# Patient Record
Sex: Female | Born: 1950 | Race: Black or African American | Hispanic: No | Smoking: Current every day smoker
Health system: Southern US, Community
[De-identification: ages and names within clinical notes are randomized; demographics above are authoritative.]

## PROBLEM LIST (undated history)

## (undated) DIAGNOSIS — E119 Type 2 diabetes mellitus without complications: Secondary | ICD-10-CM

## (undated) DIAGNOSIS — I10 Essential (primary) hypertension: Secondary | ICD-10-CM

## (undated) DIAGNOSIS — N183 Chronic kidney disease, stage 3 unspecified: Secondary | ICD-10-CM

## (undated) DIAGNOSIS — F259 Schizoaffective disorder, unspecified: Secondary | ICD-10-CM

## (undated) DIAGNOSIS — F411 Generalized anxiety disorder: Secondary | ICD-10-CM

---

## 2004-04-14 ENCOUNTER — Inpatient Hospital Stay (HOSPITAL_COMMUNITY): Admission: EM | Admit: 2004-04-14 | Discharge: 2004-04-20 | Payer: Self-pay | Admitting: Emergency Medicine

## 2004-04-17 ENCOUNTER — Encounter: Payer: Self-pay | Admitting: Pulmonary Disease

## 2004-04-24 ENCOUNTER — Encounter: Admission: RE | Admit: 2004-04-24 | Discharge: 2004-04-24 | Payer: Self-pay | Admitting: Nephrology

## 2004-05-23 ENCOUNTER — Encounter: Admission: RE | Admit: 2004-05-23 | Discharge: 2004-05-23 | Payer: Self-pay | Admitting: Nephrology

## 2004-10-30 IMAGING — CR DG ANKLE COMPLETE 3+V*R*
3 series · 3 of 3 positions shown · non-contrast
Comparison: none

CLINICAL DATA: Pain and swelling of both ankles.  
 LEFT ANKLE (THREE VIEWS)
 IMPRESSION
 1.  Nonspecific generalized soft tissue swelling. 
 RIGHT ANKLE (THREE VIEWS)
 Again, there is diffuse nonspecific soft tissue swelling.  No abnormality of the ankle joint itself.  Ordinary mild degenerative changes are seen in the midfoot.  There are incidental calcaneal spurs. 
 1.  Nonspecific generalized soft tissue swelling without likely significant osseous or articular pathology.

[view not recorded (1 of 3)]
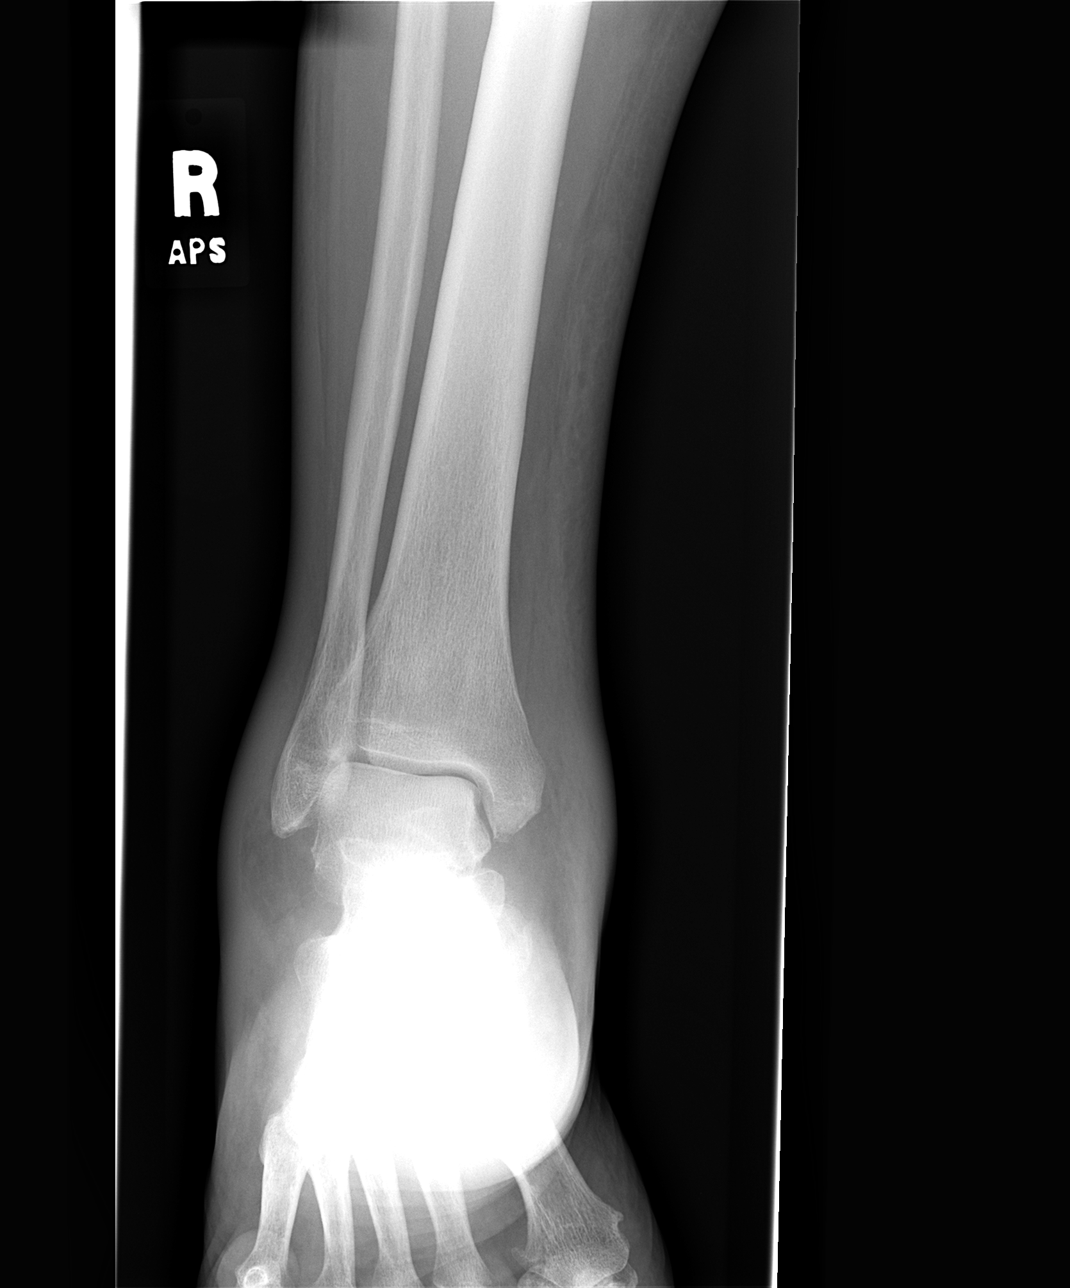

[view not recorded (2 of 3)]
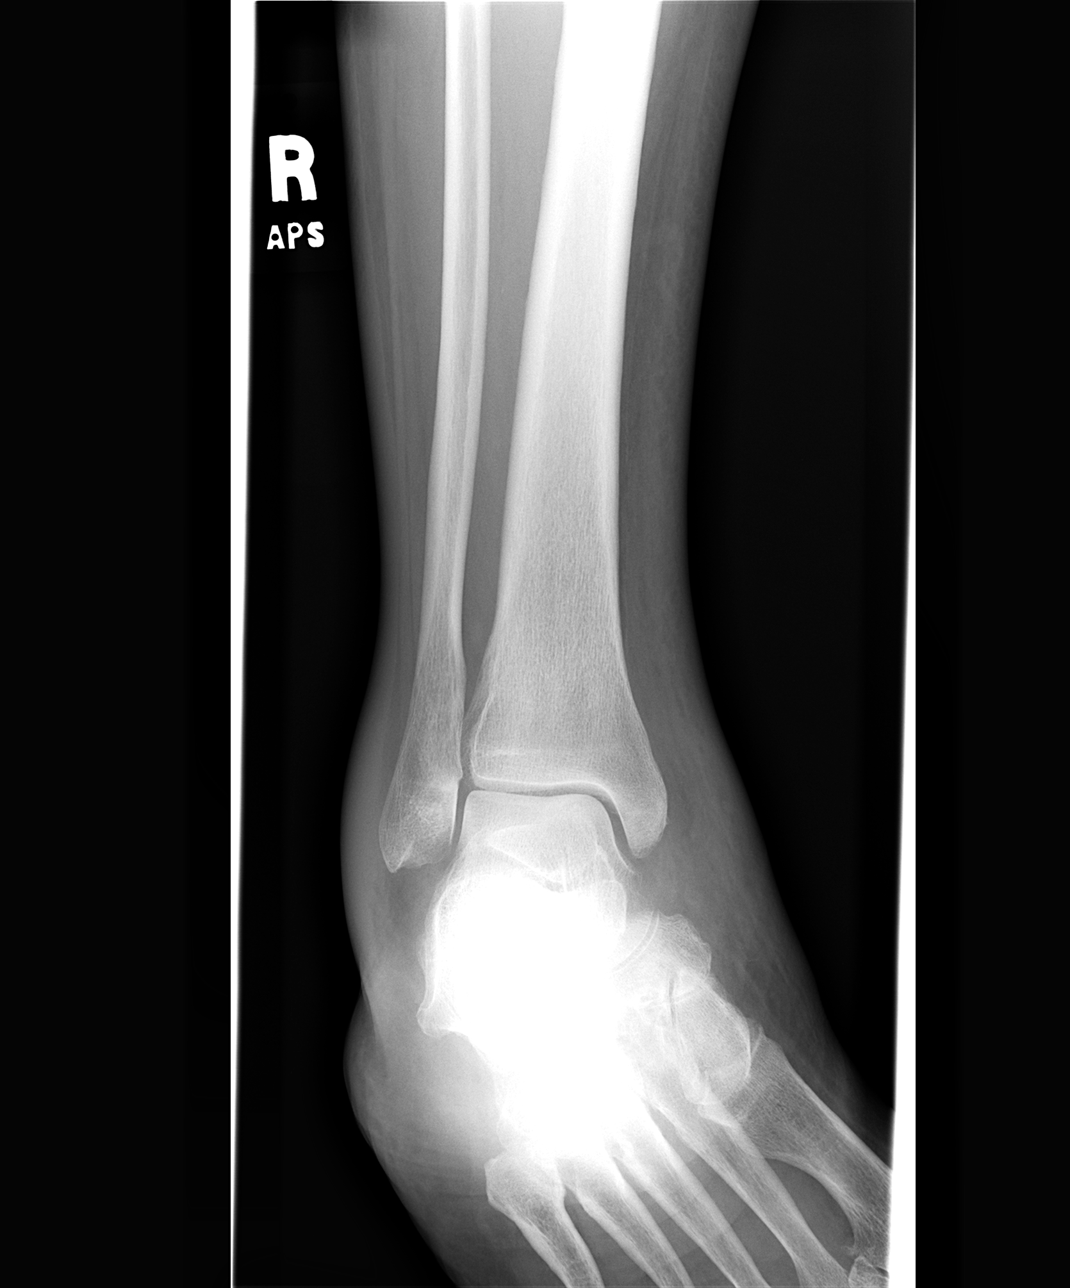

[view not recorded (3 of 3)]
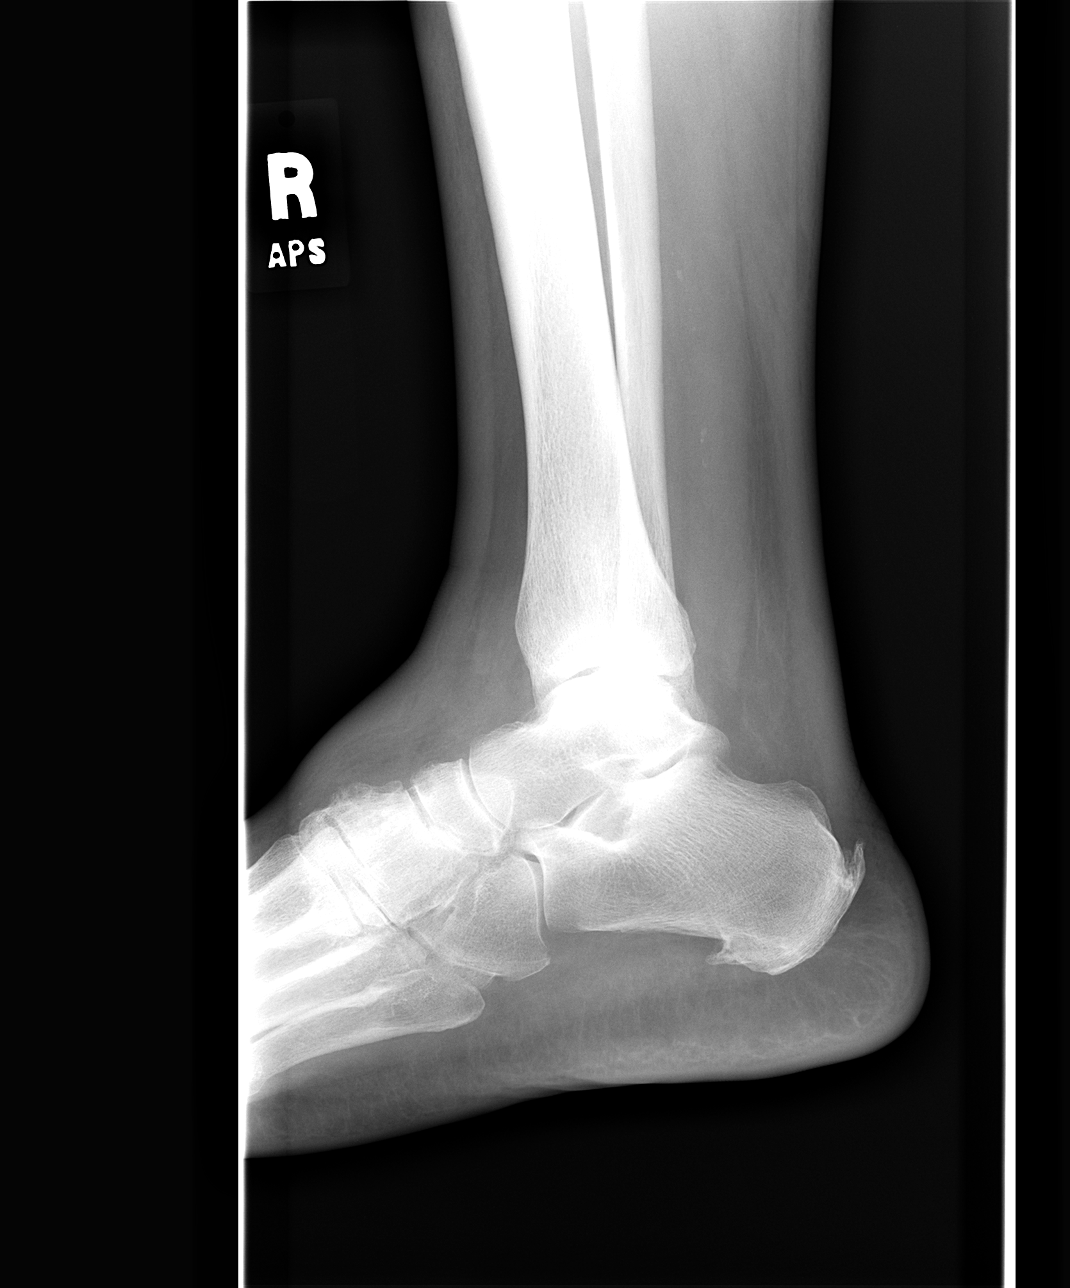

[3 of 3 positions shown; findings below may reference images not displayed]

## 2005-03-12 ENCOUNTER — Other Ambulatory Visit: Admission: RE | Admit: 2005-03-12 | Discharge: 2005-03-12 | Payer: Self-pay | Admitting: Nephrology

## 2006-05-12 ENCOUNTER — Emergency Department (HOSPITAL_COMMUNITY): Admission: EM | Admit: 2006-05-12 | Discharge: 2006-05-12 | Payer: Self-pay | Admitting: Emergency Medicine

## 2020-06-07 ENCOUNTER — Emergency Department (HOSPITAL_COMMUNITY): Payer: Medicare HMO

## 2020-06-07 ENCOUNTER — Other Ambulatory Visit: Payer: Self-pay

## 2020-06-07 ENCOUNTER — Inpatient Hospital Stay (HOSPITAL_COMMUNITY)
Admission: EM | Admit: 2020-06-07 | Discharge: 2020-06-14 | DRG: 177 | Disposition: A | Discharge status: Skilled Nursing Facility | Payer: Medicare HMO | Source: Skilled Nursing Facility | Attending: Internal Medicine | Admitting: Internal Medicine

## 2020-06-07 ENCOUNTER — Encounter (HOSPITAL_COMMUNITY): Payer: Self-pay | Admitting: Emergency Medicine

## 2020-06-07 DIAGNOSIS — Z886 Allergy status to analgesic agent status: Secondary | ICD-10-CM

## 2020-06-07 DIAGNOSIS — E875 Hyperkalemia: Secondary | ICD-10-CM | POA: Diagnosis present

## 2020-06-07 DIAGNOSIS — Z823 Family history of stroke: Secondary | ICD-10-CM

## 2020-06-07 DIAGNOSIS — N183 Chronic kidney disease, stage 3 unspecified: Secondary | ICD-10-CM | POA: Diagnosis not present

## 2020-06-07 DIAGNOSIS — D631 Anemia in chronic kidney disease: Secondary | ICD-10-CM | POA: Diagnosis present

## 2020-06-07 DIAGNOSIS — F411 Generalized anxiety disorder: Secondary | ICD-10-CM | POA: Diagnosis present

## 2020-06-07 DIAGNOSIS — N179 Acute kidney failure, unspecified: Secondary | ICD-10-CM | POA: Diagnosis present

## 2020-06-07 DIAGNOSIS — E869 Volume depletion, unspecified: Secondary | ICD-10-CM | POA: Diagnosis present

## 2020-06-07 DIAGNOSIS — Z83511 Family history of glaucoma: Secondary | ICD-10-CM

## 2020-06-07 DIAGNOSIS — Z781 Physical restraint status: Secondary | ICD-10-CM | POA: Diagnosis not present

## 2020-06-07 DIAGNOSIS — R1312 Dysphagia, oropharyngeal phase: Secondary | ICD-10-CM | POA: Diagnosis present

## 2020-06-07 DIAGNOSIS — R4182 Altered mental status, unspecified: Secondary | ICD-10-CM

## 2020-06-07 DIAGNOSIS — E1122 Type 2 diabetes mellitus with diabetic chronic kidney disease: Secondary | ICD-10-CM | POA: Diagnosis present

## 2020-06-07 DIAGNOSIS — Z8249 Family history of ischemic heart disease and other diseases of the circulatory system: Secondary | ICD-10-CM | POA: Diagnosis not present

## 2020-06-07 DIAGNOSIS — N1832 Chronic kidney disease, stage 3b: Secondary | ICD-10-CM | POA: Diagnosis present

## 2020-06-07 DIAGNOSIS — R0902 Hypoxemia: Secondary | ICD-10-CM

## 2020-06-07 DIAGNOSIS — J9601 Acute respiratory failure with hypoxia: Secondary | ICD-10-CM | POA: Diagnosis present

## 2020-06-07 DIAGNOSIS — R471 Dysarthria and anarthria: Secondary | ICD-10-CM | POA: Diagnosis present

## 2020-06-07 DIAGNOSIS — G9341 Metabolic encephalopathy: Secondary | ICD-10-CM | POA: Diagnosis present

## 2020-06-07 DIAGNOSIS — F329 Major depressive disorder, single episode, unspecified: Secondary | ICD-10-CM | POA: Diagnosis present

## 2020-06-07 DIAGNOSIS — J690 Pneumonitis due to inhalation of food and vomit: Secondary | ICD-10-CM | POA: Diagnosis present

## 2020-06-07 DIAGNOSIS — E669 Obesity, unspecified: Secondary | ICD-10-CM | POA: Diagnosis present

## 2020-06-07 DIAGNOSIS — F259 Schizoaffective disorder, unspecified: Secondary | ICD-10-CM | POA: Diagnosis present

## 2020-06-07 DIAGNOSIS — Z20822 Contact with and (suspected) exposure to covid-19: Secondary | ICD-10-CM | POA: Diagnosis present

## 2020-06-07 DIAGNOSIS — I129 Hypertensive chronic kidney disease with stage 1 through stage 4 chronic kidney disease, or unspecified chronic kidney disease: Secondary | ICD-10-CM | POA: Diagnosis present

## 2020-06-07 DIAGNOSIS — Z8673 Personal history of transient ischemic attack (TIA), and cerebral infarction without residual deficits: Secondary | ICD-10-CM

## 2020-06-07 DIAGNOSIS — Z79899 Other long term (current) drug therapy: Secondary | ICD-10-CM

## 2020-06-07 DIAGNOSIS — N170 Acute kidney failure with tubular necrosis: Secondary | ICD-10-CM | POA: Diagnosis not present

## 2020-06-07 DIAGNOSIS — Z882 Allergy status to sulfonamides status: Secondary | ICD-10-CM

## 2020-06-07 DIAGNOSIS — Z96649 Presence of unspecified artificial hip joint: Secondary | ICD-10-CM | POA: Diagnosis present

## 2020-06-07 DIAGNOSIS — E1121 Type 2 diabetes mellitus with diabetic nephropathy: Secondary | ICD-10-CM | POA: Diagnosis not present

## 2020-06-07 DIAGNOSIS — Z833 Family history of diabetes mellitus: Secondary | ICD-10-CM | POA: Diagnosis not present

## 2020-06-07 DIAGNOSIS — Z6837 Body mass index (BMI) 37.0-37.9, adult: Secondary | ICD-10-CM | POA: Diagnosis not present

## 2020-06-07 DIAGNOSIS — R0602 Shortness of breath: Secondary | ICD-10-CM | POA: Diagnosis not present

## 2020-06-07 DIAGNOSIS — Z794 Long term (current) use of insulin: Secondary | ICD-10-CM

## 2020-06-07 DIAGNOSIS — J189 Pneumonia, unspecified organism: Secondary | ICD-10-CM

## 2020-06-07 DIAGNOSIS — Z7982 Long term (current) use of aspirin: Secondary | ICD-10-CM

## 2020-06-07 HISTORY — DX: Type 2 diabetes mellitus without complications: E11.9

## 2020-06-07 HISTORY — DX: Generalized anxiety disorder: F41.1

## 2020-06-07 HISTORY — DX: Schizoaffective disorder, unspecified: F25.9

## 2020-06-07 HISTORY — DX: Chronic kidney disease, stage 3 unspecified: N18.30

## 2020-06-07 HISTORY — DX: Essential (primary) hypertension: I10

## 2020-06-07 LAB — COMPREHENSIVE METABOLIC PANEL
ALT: 10 U/L (ref 0–44)
AST: 12 U/L — ABNORMAL LOW (ref 15–41)
Albumin: 3.5 g/dL (ref 3.5–5.0)
Alkaline Phosphatase: 60 U/L (ref 38–126)
Anion gap: 11 (ref 5–15)
BUN: 31 mg/dL — ABNORMAL HIGH (ref 8–23)
CO2: 28 mmol/L (ref 22–32)
Calcium: 9.2 mg/dL (ref 8.9–10.3)
Chloride: 103 mmol/L (ref 98–111)
Creatinine, Ser: 2.7 mg/dL — ABNORMAL HIGH (ref 0.44–1.00)
GFR calc Af Amer: 20 mL/min — ABNORMAL LOW (ref >60)
GFR calc non Af Amer: 17 mL/min — ABNORMAL LOW (ref >60)
Glucose, Bld: 146 mg/dL — ABNORMAL HIGH (ref 70–99)
Potassium: 4.4 mmol/L (ref 3.5–5.1)
Sodium: 142 mmol/L (ref 135–145)
Total Bilirubin: 0.3 mg/dL (ref 0.3–1.2)
Total Protein: 6.8 g/dL (ref 6.5–8.1)

## 2020-06-07 LAB — CBC WITH DIFFERENTIAL/PLATELET
Abs Immature Granulocytes: 0.01 10*3/uL (ref 0.00–0.07)
Basophils Absolute: 0 10*3/uL (ref 0.0–0.1)
Basophils Relative: 1 %
Eosinophils Absolute: 0.1 10*3/uL (ref 0.0–0.5)
Eosinophils Relative: 2 %
HCT: 40.6 % (ref 36.0–46.0)
Hemoglobin: 12.5 g/dL (ref 12.0–15.0)
Immature Granulocytes: 0 %
Lymphocytes Relative: 26 %
Lymphs Abs: 1.8 10*3/uL (ref 0.7–4.0)
MCH: 30.9 pg (ref 26.0–34.0)
MCHC: 30.8 g/dL (ref 30.0–36.0)
MCV: 100.5 fL — ABNORMAL HIGH (ref 80.0–100.0)
Monocytes Absolute: 0.6 10*3/uL (ref 0.1–1.0)
Monocytes Relative: 8 %
Neutro Abs: 4.3 10*3/uL (ref 1.7–7.7)
Neutrophils Relative %: 63 %
Platelets: 172 10*3/uL (ref 150–400)
RBC: 4.04 MIL/uL (ref 3.87–5.11)
RDW: 13.5 % (ref 11.5–15.5)
WBC: 6.8 10*3/uL (ref 4.0–10.5)
nRBC: 0 % (ref 0.0–0.2)

## 2020-06-07 LAB — URINALYSIS, ROUTINE W REFLEX MICROSCOPIC
Bacteria, UA: NONE SEEN
Bilirubin Urine: NEGATIVE
Glucose, UA: NEGATIVE mg/dL
Hgb urine dipstick: NEGATIVE
Ketones, ur: NEGATIVE mg/dL
Nitrite: NEGATIVE
Protein, ur: 100 mg/dL — AB
Specific Gravity, Urine: 1.018 (ref 1.005–1.030)
pH: 5 (ref 5.0–8.0)

## 2020-06-07 LAB — HEMOGLOBIN A1C
Hgb A1c MFr Bld: 6.8 % — ABNORMAL HIGH (ref 4.8–5.6)
Mean Plasma Glucose: 148.46 mg/dL

## 2020-06-07 LAB — I-STAT CHEM 8, ED
BUN: 41 mg/dL — ABNORMAL HIGH (ref 8–23)
Calcium, Ion: 1.2 mmol/L (ref 1.15–1.40)
Chloride: 104 mmol/L (ref 98–111)
Creatinine, Ser: 3 mg/dL — ABNORMAL HIGH (ref 0.44–1.00)
Glucose, Bld: 140 mg/dL — ABNORMAL HIGH (ref 70–99)
HCT: 38 % (ref 36.0–46.0)
Hemoglobin: 12.9 g/dL (ref 12.0–15.0)
Potassium: 5.4 mmol/L — ABNORMAL HIGH (ref 3.5–5.1)
Sodium: 142 mmol/L (ref 135–145)
TCO2: 30 mmol/L (ref 22–32)

## 2020-06-07 LAB — BLOOD GAS, ARTERIAL
Acid-Base Excess: 0.5 mmol/L (ref 0.0–2.0)
Bicarbonate: 26.5 mmol/L (ref 20.0–28.0)
Drawn by: 257701
O2 Saturation: 92.1 %
Patient temperature: 97
pCO2 arterial: 51.7 mmHg — ABNORMAL HIGH (ref 32.0–48.0)
pH, Arterial: 7.33 — ABNORMAL LOW (ref 7.350–7.450)
pO2, Arterial: 71.8 mmHg — ABNORMAL LOW (ref 83.0–108.0)

## 2020-06-07 LAB — APTT: aPTT: 26 seconds (ref 24–36)

## 2020-06-07 LAB — CBG MONITORING, ED: Glucose-Capillary: 107 mg/dL — ABNORMAL HIGH (ref 70–99)

## 2020-06-07 LAB — TROPONIN I (HIGH SENSITIVITY)
Troponin I (High Sensitivity): 26 ng/L — ABNORMAL HIGH (ref <18)
Troponin I (High Sensitivity): 24 ng/L — ABNORMAL HIGH (ref <18)

## 2020-06-07 LAB — SARS CORONAVIRUS 2 BY RT PCR (HOSPITAL ORDER, PERFORMED IN ~~LOC~~ HOSPITAL LAB): SARS Coronavirus 2: NEGATIVE

## 2020-06-07 LAB — PROTIME-INR
INR: 0.9 (ref 0.8–1.2)
Prothrombin Time: 12 seconds (ref 11.4–15.2)

## 2020-06-07 LAB — LACTIC ACID, PLASMA: Lactic Acid, Venous: 1.5 mmol/L (ref 0.5–1.9)

## 2020-06-07 LAB — MRSA PCR SCREENING: MRSA by PCR: NEGATIVE

## 2020-06-07 LAB — PROCALCITONIN: Procalcitonin: <0.1 ng/mL

## 2020-06-07 LAB — BRAIN NATRIURETIC PEPTIDE: B Natriuretic Peptide: 169.4 pg/mL — ABNORMAL HIGH (ref 0.0–100.0)

## 2020-06-07 LAB — GLUCOSE, CAPILLARY
Glucose-Capillary: 77 mg/dL (ref 70–99)
Glucose-Capillary: 89 mg/dL (ref 70–99)

## 2020-06-07 MED ORDER — SODIUM CHLORIDE 0.9 % IV BOLUS
250.0000 mL | Freq: Once | INTRAVENOUS | Status: AC
  Administered 2020-06-07: 250 mL via INTRAVENOUS

## 2020-06-07 MED ORDER — NICOTINE 14 MG/24HR TD PT24
14.0000 mg | MEDICATED_PATCH | Freq: Every day | TRANSDERMAL | Status: DC
  Administered 2020-06-08 – 2020-06-14 (×8): 14 mg via TRANSDERMAL
  Filled 2020-06-07 (×9): qty 1

## 2020-06-07 MED ORDER — CHLORHEXIDINE GLUCONATE CLOTH 2 % EX PADS
6.0000 | MEDICATED_PAD | Freq: Every day | CUTANEOUS | Status: DC
  Administered 2020-06-07 – 2020-06-11 (×5): 6 via TOPICAL

## 2020-06-07 MED ORDER — SODIUM CHLORIDE 0.9 % IV SOLN
1.5000 g | Freq: Two times a day (BID) | INTRAVENOUS | Status: DC
  Administered 2020-06-07 – 2020-06-09 (×4): 1.5 g via INTRAVENOUS
  Filled 2020-06-07: qty 4
  Filled 2020-06-07: qty 1.5
  Filled 2020-06-07 (×2): qty 4

## 2020-06-07 MED ORDER — VANCOMYCIN HCL 2000 MG/400ML IV SOLN
2000.0000 mg | INTRAVENOUS | Status: DC
  Filled 2020-06-07: qty 400

## 2020-06-07 MED ORDER — SODIUM CHLORIDE 0.9 % IV SOLN: INTRAVENOUS | Status: DC

## 2020-06-07 MED ORDER — INSULIN ASPART 100 UNIT/ML ~~LOC~~ SOLN
0.0000 [IU] | SUBCUTANEOUS | Status: DC
  Administered 2020-06-08 – 2020-06-10 (×2): 2 [IU] via SUBCUTANEOUS
  Administered 2020-06-11: 3 [IU] via SUBCUTANEOUS
  Administered 2020-06-11: 2 [IU] via SUBCUTANEOUS
  Administered 2020-06-12: 3 [IU] via SUBCUTANEOUS
  Administered 2020-06-12 – 2020-06-14 (×3): 2 [IU] via SUBCUTANEOUS

## 2020-06-07 MED ORDER — CHLORHEXIDINE GLUCONATE 0.12 % MT SOLN
15.0000 mL | Freq: Two times a day (BID) | OROMUCOSAL | Status: DC
  Administered 2020-06-07 – 2020-06-13 (×11): 15 mL via OROMUCOSAL
  Filled 2020-06-07 (×11): qty 15

## 2020-06-07 MED ORDER — HEPARIN SODIUM (PORCINE) 5000 UNIT/ML IJ SOLN
5000.0000 [IU] | Freq: Three times a day (TID) | INTRAMUSCULAR | Status: DC
  Administered 2020-06-07 – 2020-06-14 (×20): 5000 [IU] via SUBCUTANEOUS
  Filled 2020-06-07 (×19): qty 1

## 2020-06-07 MED ORDER — VANCOMYCIN HCL 10 G IV SOLR
2250.0000 mg | INTRAVENOUS | Status: DC
  Filled 2020-06-07: qty 2250

## 2020-06-07 MED ORDER — SODIUM CHLORIDE 0.9 % IV SOLN
2.0000 g | Freq: Once | INTRAVENOUS | Status: DC
  Filled 2020-06-07: qty 2

## 2020-06-07 MED ORDER — ORAL CARE MOUTH RINSE
15.0000 mL | Freq: Two times a day (BID) | OROMUCOSAL | Status: DC
  Administered 2020-06-08 – 2020-06-09 (×4): 15 mL via OROMUCOSAL

## 2020-06-07 NOTE — Hospital Course (Addendum)
Carrie Benson is a 69 yo female with PMH hypertension, CKDIII, depression, schizoaffective disorder, GAD, type 2 diabetes who presented from her SNF appearing altered and hypoxic.  She was reportedly only responsive to painful stimuli upon arrival.  She was requiring 10 L nonrebreather via EMS on arrival which improved to 4 L nasal cannula oxygen in the ER.   She is a poor historian and is unable to provide any significant collateral information.  No family is present in the ER. There was concern for possible sepsis and she was started on vancomycin and cefepime. 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 Lab work-up notable for: UA small leukocyte, nitrite negative, no bacteria seen COVID-19 negative ABG 7.33/51/71 WBC 6.8, hemoglobin 12.5, platelets 172 Sodium 142, K4.4, BUN 31, creatinine 2.7  During interview, she appeared to be overall volume depleted.  She has several missing teeth and overall poor dentition along with dysarthric speech which did further raise concern for possible aspiration.  Her baseline is not quite known. Given her significantly decreased mentation on arrival and hypoxia she is admitted to the SDU for close monitoring overnight.

## 2020-06-07 NOTE — ED Provider Notes (Signed)
[facility]-EMERGENCY DEPT Provider Note   CSN: [id number] Arrival date & time: 06/07/20  1543     History Chief Complaint  Patient presents with  . Shortness of Breath    Carrie Benson is a 69 y.o. female.  The history is provided by the patient, medical records, the EMS personnel and a relative. No language interpreter was used.  Shortness of Breath  Carrie Benson is a 69 y.o. female who presents to the Emergency Department complaining of hypoxia. Level V caveat due to altered mental status. History is provided by EMS. She presents the emergency department by EMS from a nursing facility for hypoxia that started this morning. She was found to have sats in the 80s on room air and was placed on a simple facemask at 5 L with increase in sats to 95%. The report increased lethargy today and EMS was called. On ED arrival she has no complaints but is minimally verbal.  She is a resident of [facility] and rehab. Emergency contact Belva Bertin, [phone]    Past Medical History:  Diagnosis Date  . CKD (chronic kidney disease) stage 3, GFR 30-59 ml/min   . Diabetes mellitus (HCC)   . GAD (generalized anxiety disorder)   . HTN (hypertension)   . Schizoaffective disorder [facility])     Patient Active Problem List   Diagnosis Date Noted  . History of hip replacement 06/07/2020  . Aspiration pneumonia (HCC) 06/07/2020  . Acute respiratory failure with hypoxia (HCC) 06/07/2020  . Type 2 diabetes mellitus with stage 3 chronic kidney disease (HCC) 06/07/2020  . Acute renal failure superimposed on stage 3 chronic kidney disease (HCC) 06/07/2020      OB History   No obstetric history on file.     Family History  Problem Relation Age of Onset  . Diabetes Mother   . Hypertension Mother   . Stroke Mother   . Diabetes Father   . Glaucoma Father   . Hypertension Father   . Stroke Father     Social History   Tobacco Use  . Smoking status: Not on  file  Substance Use Topics  . Alcohol use: Not on file  . Drug use: Not on file    Home Medications Prior to Admission medications   Medication Sig Start Date End Date Taking? Authorizing Provider  acidophilus (RISAQUAD) CAPS capsule Take 1 capsule by mouth daily.   Yes [provider]  albuterol (VENTOLIN HFA) 108 (90 Base) MCG/ACT inhaler Inhale 2 puffs into the lungs every 6 (six) hours as needed for wheezing or shortness of breath.   Yes [provider]  amLODipine (NORVASC) 10 MG tablet Take 10 mg by mouth daily.   Yes [provider]  amoxicillin-clavulanate (AUGMENTIN) 875-125 MG tablet Take 1 tablet by mouth 2 (two) times daily.   Yes [provider]  aspirin EC 81 MG tablet Take 81 mg by mouth daily. Swallow whole.   Yes [provider]  atorvastatin (LIPITOR) 80 MG tablet Take 80 mg by mouth daily. 04/19/15  Yes [provider]  benztropine (COGENTIN) 0.5 MG tablet Take 0.5 mg by mouth 2 (two) times daily. 01/18/20  Yes [provider]  chlorhexidine (PERIDEX) 0.12 % solution Use as directed 15 mLs in the mouth or throat 4 (four) times daily.    Yes [provider]  FLUoxetine (PROZAC) 20 MG capsule Take 20 mg by mouth daily. 01/18/20  Yes [provider]  insulin lispro (HUMALOG) 100 UNIT/ML injection Inject 2-8 Units into the skin in the morning, at noon, and at bedtime. 0-200= 0 units 201-250= 2 units 251-300= 4 units 301-350= 6 units 351-400=8 units >400 notify MD 06/04/20  Yes [provider]  ipratropium-albuterol (DUONEB) 0.5-2.5 (3) MG/3ML SOLN Take 3 mLs by nebulization in the morning and at bedtime.   Yes [provider]  metoprolol tartrate (LOPRESSOR) 25 MG tablet Take 25 mg by mouth 2 (two) times daily.   Yes [provider]  mirabegron ER (MYRBETRIQ) 50 MG TB24 tablet Take 50 mg by mouth daily. 04/19/15  Yes [provider]  nicotine (NICODERM CQ - DOSED IN MG/24  HR) 7 mg/24hr patch Place 7 mg onto the skin daily.   Yes [provider]  pantoprazole (PROTONIX) 40 MG tablet Take 40 mg by mouth daily.   Yes [provider]  risperiDONE (RISPERDAL) 2 MG tablet Take 2 mg by mouth 2 (two) times daily. 01/18/20  Yes [provider]  traZODone (DESYREL) 50 MG tablet Take 25 mg by mouth at bedtime.   Yes [provider]    Allergies    Nsaids and Sulfa antibiotics  Review of Systems   Review of Systems  Unable to perform ROS: Mental status change  Respiratory: Positive for shortness of breath.     Physical Exam Updated Vital Signs BP (!) 146/73   Pulse 66   Temp 98 F (36.7 C) (Oral)   Resp 17   Ht 5\' 3"  (1.6 m)   Wt 95.3 kg   SpO2 95%   BMI 37.20 kg/m   Physical Exam Vitals and nursing note reviewed.  Constitutional:      Appearance: She is well-developed.     Comments: Lethargic  HENT:     Head: Normocephalic and atraumatic.     Comments: Left eye enucleation, chronic Cardiovascular:     Rate and Rhythm: Normal rate and regular rhythm.     Heart sounds: No murmur heard.   Pulmonary:     Effort: Pulmonary effort is normal. No respiratory distress.     Comments: Diffuse crackles Abdominal:     Palpations: Abdomen is soft.     Tenderness: There is no abdominal tenderness. There is no guarding or rebound.  Musculoskeletal:        General: No tenderness.  Skin:    General: Skin is warm and dry.  Neurological:     Comments: Lethargic. Arouses to voice. Profound generalized weakness. Mumbling speech.  Psychiatric:     Comments: Unable to assess.      ED Results / Procedures / Treatments   Labs (all labs ordered are listed, but only abnormal results are displayed) Labs Reviewed  COMPREHENSIVE METABOLIC PANEL - Abnormal; Notable for the following components:      Result Value   Glucose, Bld 146 (*)    BUN 31 (*)    Creatinine, Ser 2.70 (*)    AST 12 (*)    GFR calc non Af Amer 17 (*)     GFR calc Af Amer 20 (*)    All other components within normal limits  CBC WITH DIFFERENTIAL/PLATELET - Abnormal; Notable for the following components:   MCV 100.5 (*)    All other components within normal limits  URINALYSIS, ROUTINE W REFLEX MICROSCOPIC - Abnormal; Notable for the following components:   Protein, ur 100 (*)    Leukocytes,Ua SMALL (*)    All other components within normal limits  BRAIN  NATRIURETIC PEPTIDE - Abnormal; Notable for the following components:   B Natriuretic Peptide 169.4 (*)    All other components within normal limits  BLOOD GAS, ARTERIAL - Abnormal; Notable for the following components:   pH, Arterial 7.330 (*)    pCO2 arterial 51.7 (*)    pO2, Arterial 71.8 (*)    All other components within normal limits  HEMOGLOBIN A1C - Abnormal; Notable for the following components:   Hgb A1c MFr Bld 6.8 (*)    All other components within normal limits  I-STAT CHEM 8, ED - Abnormal; Notable for the following components:   Potassium 5.4 (*)    BUN 41 (*)    Creatinine, Ser 3.00 (*)    Glucose, Bld 140 (*)    All other components within normal limits  CBG MONITORING, ED - Abnormal; Notable for the following components:   Glucose-Capillary 107 (*)    All other components within normal limits  TROPONIN I (HIGH SENSITIVITY) - Abnormal; Notable for the following components:   Troponin I (High Sensitivity) 26 (*)    All other components within normal limits  TROPONIN I (HIGH SENSITIVITY) - Abnormal; Notable for the following components:   Troponin I (High Sensitivity) 24 (*)    All other components within normal limits  SARS CORONAVIRUS 2 BY RT PCR (HOSPITAL ORDER, PERFORMED IN [facility])  MRSA PCR SCREENING  CULTURE, BLOOD (ROUTINE X 2)  CULTURE, BLOOD (ROUTINE X 2)  URINE CULTURE  EXPECTORATED SPUTUM ASSESSMENT W REFEX TO RESP CULTURE  LACTIC ACID, PLASMA  APTT  PROTIME-INR  PROCALCITONIN  GLUCOSE, CAPILLARY  BASIC METABOLIC PANEL  CBC WITH  DIFFERENTIAL/PLATELET  MAGNESIUM  PROCALCITONIN  GLUCOSE, CAPILLARY    EKG EKG Interpretation  Date/Time:  Wednesday June 07 2020 16:24:40 EDT Ventricular Rate:  61 PR Interval:    QRS Duration: 88 QT Interval:  430 QTC Calculation: 434 R Axis:   15 Text Interpretation: Sinus rhythm Probable left atrial enlargement Posterior infarct, old Confirmed by Tilden Fossa [phone]) on 06/07/2020 5:26:11 PM   Radiology CT Head Wo Contrast  Result Date: 06/07/2020 CLINICAL DATA:  Altered mental status EXAM: CT HEAD WITHOUT CONTRAST TECHNIQUE: Contiguous axial images were obtained from the base of the skull through the vertex without intravenous contrast. COMPARISON:  08/28/2017 FINDINGS: Brain: Encephalomalacia is again seen within the a para falcine right frontal lobe, left corona radiata and cerebellar hemispheres bilaterally in keeping with remote infarcts. Since the prior examination, there has developed encephalomalacia within the right parietal lobe, left para falcine occipital lobe, and posterior left temporal lobe in keeping with remote infarcts, though new from prior examination. There is no evidence of acute intracranial hemorrhage or infarct. Moderate periventricular white matter changes are present likely reflecting the sequela of small vessel ischemia. No abnormal mass effect or midline shift. No abnormal intra or extra-axial mass lesion or fluid collection. The ventricular size is normal. Vascular: No hyperdense vasculature noted at the skull base. Extensive atherosclerotic calcification is seen within the carotid siphons. Skull: Intact Sinuses/Orbits: The paranasal sinuses are clear. 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 Right orbit is unremarkable. Other: Mastoid air cells and middle ear cavities are clear. Subcutaneous soft tissue nodule within the frontal scalp, axial image # 21/2, is indeterminate, but enlarged since prior examination measuring 11 mm. No erosion of the subjacent  calvarium. 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 No evidence of acute intracranial hemorrhage or infarct, however. Electronically Signed  By: Helyn NumbersAshesh  Parikh MD   On: 06/07/2020 18:40   DG Chest Port 1 View  Result Date: 06/07/2020 CLINICAL DATA:  Altered mental status EXAM: PORTABLE CHEST 1 VIEW COMPARISON:  05/29/2020 FINDINGS: Bibasilar pulmonary infiltrates have developed, infection versus aspiration. No pneumothorax or pleural effusion. Cardiac size within normal limits. Pulmonary vascularity is normal. No acute bone abnormality. IMPRESSION: Interval development of bibasilar pulmonary infiltrates, infection versus aspiration. Electronically Signed   By: Helyn NumbersAshesh  Parikh MD   On: 06/07/2020 18:05    Procedures Procedures (including critical care time)  Medications Ordered in ED Medications  heparin injection 5,000 Units (5,000 Units Subcutaneous Given 06/07/20 2220)  ampicillin-sulbactam (UNASYN) 1.5 g in sodium chloride 0.9 % 100 mL IVPB (0 g Intravenous Stopped 06/07/20 2248)  Chlorhexidine Gluconate Cloth 2 % PADS 6 each (6 each Topical Given 06/07/20 2059)  chlorhexidine (PERIDEX) 0.12 % solution 15 mL (15 mLs Mouth Rinse Given 06/07/20 2220)  MEDLINE mouth rinse (has no administration in time range)  insulin aspart (novoLOG) injection 0-15 Units (0 Units Subcutaneous Not Given 06/07/20 2344)  0.9 %  sodium chloride infusion ( Intravenous New Bag/Given 06/07/20 2216)  nicotine (NICODERM CQ - dosed in mg/24 hours) patch 14 mg (has no administration in time range)  sodium chloride 0.9 % bolus 250 mL (250 mLs Intravenous New Bag/Given 06/07/20 2216)    ED Course  I have reviewed the triage vital signs and the nursing notes.  Pertinent labs & imaging results that were available during my care of the patient were reviewed by me and considered in my medical decision making (see chart for details).    MDM  Rules/Calculators/A&P                         Patient here for evaluation of altered mental status and increased oxygen requirement. On initial evaluation patient lethargic, requiring increased oxygen. On repeat assessment during her ED stay her mental status is improved but she continues to be confused. She does appear mildly dehydrated. Chest x-ray concerning for pneumonia, she was started on antibiotics. Plan to admit for further treatment. Medicine consulted for admission.  Final Clinical Impression(s) / ED Diagnoses Final diagnoses:  None    Rx / DC Orders ED Discharge Orders    None       Tilden Fossaees, Saralee Bolick, MD 06/07/20 2357

## 2020-06-07 NOTE — H&P (Signed)
History and Physical    Neri Vieyra  KCL:[id number]  DOB: Aug 16, 1951  DOA: 06/07/2020  PCP: Patient, No Pcp Per Patient coming from: SNF  Chief Complaint: AMS  HPI:  Ms. Soyars is a 69 yo female with PMH hypertension, CKDIII, depression, schizoaffective disorder, GAD, type 2 diabetes who presented from her SNF appearing altered and hypoxic.  She was reportedly only responsive to painful stimuli upon arrival.  She was requiring 10 L nonrebreather via EMS on arrival which improved to 4 L nasal cannula oxygen in the ER.   She is a poor historian and is unable to provide any significant collateral information.  No family is present in the ER. There was concern for possible sepsis and she was started on vancomycin and cefepime. 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 Lab work-up notable for: UA small leukocyte, nitrite negative, no bacteria seen COVID-19 negative ABG 7.33/51/71 WBC 6.8, hemoglobin 12.5, platelets 172 Sodium 142, K4.4, BUN 31, creatinine 2.7  During interview, she appeared to be overall volume depleted.  She has several missing teeth and overall poor dentition along with dysarthric speech which did further raise concern for possible aspiration.  Her baseline is not quite known. Given her significantly decreased mentation on arrival and hypoxia she is admitted to the SDU for close monitoring overnight.    I have personally briefly reviewed patient's old medical records in [facility]. Patient also discussed with ED provider.   Assessment/Plan: Aspiration pneumonia (HCC) - patient has bibasilar infiltrates on CXR, multiple strokes appreciated on CT head, dysarthric speech with unknown mental baseline.  Certainly is at high risk for aspiration.  Does not appear toxic or septic on admission but mentation was waxing and waning during interview -Hold off on vancomycin  and cefepime.  Start on Unasyn for presumed aspiration at this time -Keep n.p.o. -SLP eval in a.m. - check PCT   Acute respiratory failure with hypoxia (HCC) -RT eval -Continue O2 and wean as able  Type 2 diabetes mellitus with stage 3 chronic kidney disease (HCC) -Check A1c -Continue on every 4 hours SSI.  Transition to ACHS if passes swallow eval  Acute renal failure superimposed on stage 3 chronic kidney disease (HCC) -For now will suspect prerenal/decreased oral intake given clinically volume depleted and notably dry mucous membranes -Baseline creatinine approximately 1.8 - hyperK noted, not high enough for treatment at this time; continue IVF and follow up BMP in am -Continue on fluids and follow-up BMP in a.m.   Code Status: Full DVT Prophylaxis:unfractionated SQ heparin 5000 units 2 hours prior to surgery then every 12 hours Anticipated disposition is to SNF  History: Past Medical History:  Diagnosis Date  . CKD (chronic kidney disease) stage 3, GFR 30-59 ml/min   . Diabetes mellitus (HCC)   . GAD (generalized anxiety disorder)   . HTN (hypertension)   . Schizoaffective disorder (HCC)     Allergies  Allergen Reactions  . Nsaids     Kidney Function  . Sulfa Antibiotics Swelling and Hives    Family History  Problem Relation Age of Onset  . Diabetes Mother   . Hypertension Mother   . Stroke Mother   . Diabetes Father   . Glaucoma Father   . Hypertension Father   . Stroke Father    Home Medications: Prior to Admission medications   Medication Sig Start Date End Date Taking? Authorizing Provider  benztropine (COGENTIN) 0.5 MG  tablet Take 0.5 mg by mouth 2 (two) times daily. 01/18/20   [provider]  FLUoxetine (PROZAC) 20 MG capsule Take 20 mg by mouth daily. 01/18/20   [provider]  gabapentin (NEURONTIN) 100 MG capsule Take 100 mg by mouth 3 (three) times daily. 01/18/20   [provider]  LANTUS 100 UNIT/ML injection Inject into  the skin. 01/13/20   [provider]  risperiDONE (RISPERDAL) 2 MG tablet Take 2 mg by mouth 2 (two) times daily. 01/18/20   [provider]  traZODone (DESYREL) 100 MG tablet Take 200 mg by mouth at bedtime as needed. 01/18/20   [provider]    Review of Systems:  Pertinent items noted in HPI and remainder of comprehensive ROS otherwise negative.  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 Lungs: coarse sounds anteriolateral fields; no obvious wheezing Heart: regular rate and rhythm and S1, S2 normal Abdomen: normal findings: bowel sounds normal and soft, non-tender Extremities: no edema Skin: normal Neurologic: dysarthria; strength 4/5 throughout; follows commands and moves all 4 extremities  Labs on Admission:  I have personally reviewed following labs and imaging studies Results for orders placed or performed during the hospital encounter of 06/07/20 (from the past 24 hour(s))  Lactic acid, plasma     Status: None   Collection Time: 06/07/20  4:12 PM  Result Value Ref Range   Lactic Acid, Venous 1.5 0.5 - 1.9 mmol/L  Comprehensive metabolic panel     Status: Abnormal   Collection Time: 06/07/20  4:12 PM  Result Value Ref Range   Sodium 142 135 - 145 mmol/L   Potassium 4.4 3.5 - 5.1 mmol/L   Chloride 103 98 - 111 mmol/L   CO2 28 22 - 32 mmol/L   Glucose, Bld 146 (H) 70 - 99 mg/dL   BUN 31 (H) 8 - 23 mg/dL   Creatinine, Ser 06/09/20 (H)  0.44 - 1.00 mg/dL   Calcium 9.2 8.9 - 6.38 mg/dL   Total Protein 6.8 6.5 - 8.1 g/dL   Albumin 3.5 3.5 - 5.0 g/dL   AST 12 (L) 15 - 41 U/L   ALT 10 0 - 44 U/L   Alkaline Phosphatase 60 38 - 126 U/L   Total Bilirubin 0.3 0.3 - 1.2 mg/dL   GFR calc non Af Amer 17 (L) >60 mL/min   GFR calc Af Amer 20 (L) >60 mL/min   Anion gap 11 5 - 15  CBC WITH DIFFERENTIAL     Status: Abnormal   Collection Time: 06/07/20  4:12 PM  Result Value Ref Range   WBC 6.8 4.0 - 10.5 K/uL   RBC 4.04 3.87 - 5.11 MIL/uL   Hemoglobin 12.5 12.0 - 15.0 g/dL   HCT 06/09/20 36 - 46 %   MCV 100.5 (H) 80.0 - 100.0 fL   MCH 30.9  26.0 - 34.0 pg   MCHC 30.8 30.0 - 36.0 g/dL   RDW 43.3 29.5 - 18.8 %   Platelets 172 150 - 400 K/uL   nRBC 0.0 0.0 - 0.2 %   Neutrophils Relative % 63 %   Neutro Abs 4.3 1.7 - 7.7 K/uL   Lymphocytes Relative 26 %   Lymphs Abs 1.8 0.7 - 4.0 K/uL   Monocytes Relative 8 %   Monocytes Absolute 0.6 0 - 1 K/uL   Eosinophils Relative 2 %   Eosinophils Absolute 0.1 0 - 0 K/uL   Basophils Relative 1 %   Basophils Absolute 0.0 0 - 0 K/uL   Immature Granulocytes 0 %   Abs Immature Granulocytes 0.01 0.00 - 0.07 K/uL  APTT     Status: None   Collection Time: 06/07/20  4:12 PM  Result Value Ref Range   aPTT 26 24 - 36 seconds  Protime-INR     Status: None   Collection Time: 06/07/20  4:12 PM  Result Value Ref Range   Prothrombin Time 12.0 11.4 - 15.2 seconds   INR 0.9 0.8 - 1.2  Brain natriuretic peptide     Status: Abnormal   Collection Time: 06/07/20  4:12 PM  Result Value Ref Range   B Natriuretic Peptide 169.4 (H) 0.0 - 100.0 pg/mL  Troponin I (High Sensitivity)     Status: Abnormal   Collection Time: 06/07/20  4:12 PM  Result Value Ref Range   Troponin I (High Sensitivity) 26 (H) <18 ng/L  Blood gas, arterial     Status: Abnormal   Collection Time: 06/07/20  4:40 PM  Result Value Ref Range   pH, Arterial 7.330 (L) 7.35 - 7.45   pCO2 arterial 51.7 (H) 32 - 48 mmHg   pO2, Arterial 71.8  (L) 83 - 108 mmHg   Bicarbonate 26.5 20.0 - 28.0 mmol/L   Acid-Base Excess 0.5 0.0 - 2.0 mmol/L   O2 Saturation 92.1 %   Patient temperature 97.0    Collection site RIGHT RADIAL    Drawn by [id number]    Sample type ARTERIAL   I-Stat Chem 8, ED     Status: Abnormal   Collection Time: 06/07/20  5:16 PM  Result Value Ref Range   Sodium 142 135 - 145 mmol/L   Potassium 5.4 (H) 3.5 - 5.1 mmol/L   Chloride 104 98 - 111 mmol/L   BUN 41 (H) 8 - 23 mg/dL   Creatinine, Ser 3.01 (H) 0.44 - 1.00 mg/dL   Glucose, Bld 601 (H) 70 - 99 mg/dL   Calcium, Ion 0.93 1.15 - 1.40 mmol/L   TCO2 30 22 - 32 mmol/L   Hemoglobin 12.9 12.0 - 15.0 g/dL   HCT 23.5 36 - 46 %  CBG monitoring, ED     Status: Abnormal   Collection Time: 06/07/20  5:47 PM  Result Value Ref Range   Glucose-Capillary 107 (H) 70 - 99 mg/dL  SARS Coronavirus 2 by RT PCR (hospital order, performed in [facility] hospital lab) Nasopharyngeal Nasopharyngeal Swab     Status: None   Collection Time: 06/07/20  5:56 PM   Specimen: Nasopharyngeal Swab  Result Value Ref Range   SARS Coronavirus 2 NEGATIVE NEGATIVE  Urinalysis, Routine w reflex microscopic     Status: Abnormal   Collection Time: 06/07/20  6:30 PM  Result Value Ref Range   Color, Urine YELLOW YELLOW   APPearance CLEAR CLEAR   Specific Gravity, Urine 1.018 1.005 - 1.030  pH 5.0 5.0 - 8.0   Glucose, UA NEGATIVE NEGATIVE mg/dL   Hgb urine dipstick NEGATIVE NEGATIVE   Bilirubin Urine NEGATIVE NEGATIVE   Ketones, ur NEGATIVE NEGATIVE mg/dL   Protein, ur 409100 (A) NEGATIVE mg/dL   Nitrite NEGATIVE NEGATIVE   Leukocytes,Ua SMALL (A) NEGATIVE   RBC / HPF 0-5 0 - 5 RBC/hpf   WBC, UA 11-20 0 - 5 WBC/hpf   Bacteria, UA NONE SEEN NONE SEEN   Squamous Epithelial / LPF 0-5 0 - 5     Radiological Exams on Admission: CT Head Wo Contrast  Result Date: 06/07/2020 CLINICAL DATA:  Altered mental status EXAM: CT HEAD WITHOUT CONTRAST TECHNIQUE: Contiguous axial images were obtained from  the base of the skull through the vertex without intravenous contrast. COMPARISON:  08/28/2017 FINDINGS: Brain: Encephalomalacia is again seen within the a para falcine right frontal lobe, left corona radiata and cerebellar hemispheres bilaterally in keeping with remote infarcts. Since the prior examination, there has developed encephalomalacia within the right parietal lobe, left para falcine occipital lobe, and posterior left temporal lobe in keeping with remote infarcts, though new from prior examination. There is no evidence of acute intracranial hemorrhage or infarct. Moderate periventricular white matter changes are present likely reflecting the sequela of small vessel ischemia. No abnormal mass effect or midline shift. No abnormal intra or extra-axial mass lesion or fluid collection. The ventricular size is normal. Vascular: No hyperdense vasculature noted at the skull base. Extensive atherosclerotic calcification is seen within the carotid siphons. Skull: Intact Sinuses/Orbits: The paranasal sinuses are clear. 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 Right orbit is unremarkable. Other: Mastoid air cells and middle ear cavities are clear. Subcutaneous soft tissue nodule within the frontal scalp, axial image # 21/2, is indeterminate, but enlarged since prior examination measuring 11 mm. No erosion of the subjacent calvarium. 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 No evidence of acute intracranial hemorrhage or infarct, however. Electronically Signed   By: Helyn NumbersAshesh  Parikh MD   On: 06/07/2020 18:40   DG Chest Port 1 View  Result Date: 06/07/2020 CLINICAL DATA:  Altered mental status EXAM: PORTABLE CHEST 1 VIEW COMPARISON:  05/29/2020 FINDINGS: Bibasilar pulmonary infiltrates have developed, infection versus aspiration. No pneumothorax or pleural effusion. Cardiac size within normal limits. Pulmonary vascularity is normal. No acute bone  abnormality. IMPRESSION: Interval development of bibasilar pulmonary infiltrates, infection versus aspiration. Electronically Signed   By: Helyn NumbersAshesh  Parikh MD   On: 06/07/2020 18:05   CT Head Wo Contrast  Final Result    [facility]  Final Result      Consults called:  n/a   EKG: Independently reviewed. NSR   Lewie Chamberavid Marico Buckle, MD [facility] Pager: Secure chat  If 7PM-7AM, please contact night-coverage [email or web address] Use universal [place] password for that web site. If you do not have the password, please call the hospital operator.  06/07/2020, 9:45 PM

## 2020-06-07 NOTE — Assessment & Plan Note (Signed)
-   patient has bibasilar infiltrates on CXR, multiple strokes appreciated on CT head, dysarthric speech with unknown mental baseline.  Certainly is at high risk for aspiration.  Does not appear toxic or septic on admission but mentation was waxing and waning during interview -Hold off on vancomycin and cefepime.  Start on Unasyn for presumed aspiration at this time -Keep n.p.o. -SLP eval in a.m. - check PCT

## 2020-06-07 NOTE — Assessment & Plan Note (Signed)
-  RT eval -Continue O2 and wean as able

## 2020-06-07 NOTE — Assessment & Plan Note (Addendum)
-  For now will suspect prerenal/decreased oral intake given clinically volume depleted and notably dry mucous membranes -Baseline creatinine approximately 1.8 - hyperK noted, not high enough for treatment at this time; continue IVF and follow up BMP in am -Continue on fluids and follow-up BMP in a.m.

## 2020-06-07 NOTE — Progress Notes (Addendum)
A consult was received from an ED physician for Vancomycin per pharmacy dosing.  The patient's profile has been reviewed for ht/wt/allergies/indication/available labs.    A one time order has been placed for Vancomycin 2g IV.  Further antibiotics/pharmacy consults should be ordered by admitting physician if indicated.                       Thank you, Jamse Mead 06/07/2020  6:18 PM

## 2020-06-07 NOTE — ED Triage Notes (Signed)
BIBA Per EMS: Pt come from nursing home. Pt reports normal & talking yesterday. Satting in 12s when EMS arrived. Nursing facility states this has been going on since this morning. Pt on 4L with simple mask when EMS arrived. Pt only responsive to pain upon arrival.  10L simple mask on EMS Mental status improved.  HR 64 BP 107/62 RR 14 CBG 180 Hx CHF

## 2020-06-07 NOTE — Progress Notes (Signed)
Notified Lab that ABG being sent for analysis. 

## 2020-06-07 NOTE — Assessment & Plan Note (Signed)
-  Check A1c -Continue on every 4 hours SSI.  Transition to ACHS if passes swallow eval

## 2020-06-08 ENCOUNTER — Other Ambulatory Visit: Payer: Self-pay

## 2020-06-08 ENCOUNTER — Encounter (HOSPITAL_COMMUNITY): Payer: Self-pay | Admitting: Internal Medicine

## 2020-06-08 DIAGNOSIS — E1121 Type 2 diabetes mellitus with diabetic nephropathy: Secondary | ICD-10-CM

## 2020-06-08 DIAGNOSIS — N1832 Chronic kidney disease, stage 3b: Secondary | ICD-10-CM

## 2020-06-08 LAB — GLUCOSE, CAPILLARY
Glucose-Capillary: 102 mg/dL — ABNORMAL HIGH (ref 70–99)
Glucose-Capillary: 112 mg/dL — ABNORMAL HIGH (ref 70–99)
Glucose-Capillary: 126 mg/dL — ABNORMAL HIGH (ref 70–99)
Glucose-Capillary: 86 mg/dL (ref 70–99)
Glucose-Capillary: 92 mg/dL (ref 70–99)
Glucose-Capillary: 94 mg/dL (ref 70–99)

## 2020-06-08 LAB — BASIC METABOLIC PANEL
Anion gap: 11 (ref 5–15)
BUN: 29 mg/dL — ABNORMAL HIGH (ref 8–23)
CO2: 23 mmol/L (ref 22–32)
Calcium: 8.8 mg/dL — ABNORMAL LOW (ref 8.9–10.3)
Chloride: 108 mmol/L (ref 98–111)
Creatinine, Ser: 2.33 mg/dL — ABNORMAL HIGH (ref 0.44–1.00)
GFR calc Af Amer: 24 mL/min — ABNORMAL LOW (ref >60)
GFR calc non Af Amer: 21 mL/min — ABNORMAL LOW (ref >60)
Glucose, Bld: 87 mg/dL (ref 70–99)
Potassium: 4.5 mmol/L (ref 3.5–5.1)
Sodium: 142 mmol/L (ref 135–145)

## 2020-06-08 LAB — MAGNESIUM: Magnesium: 2 mg/dL (ref 1.7–2.4)

## 2020-06-08 LAB — PROCALCITONIN: Procalcitonin: <0.1 ng/mL

## 2020-06-08 MED ORDER — BENZTROPINE MESYLATE 0.5 MG PO TABS
0.5000 mg | ORAL_TABLET | Freq: Two times a day (BID) | ORAL | Status: DC
  Administered 2020-06-08 – 2020-06-14 (×13): 0.5 mg via ORAL
  Filled 2020-06-08 (×14): qty 1

## 2020-06-08 MED ORDER — PANTOPRAZOLE SODIUM 40 MG PO TBEC
40.0000 mg | DELAYED_RELEASE_TABLET | Freq: Every day | ORAL | Status: DC
  Administered 2020-06-08 – 2020-06-14 (×7): 40 mg via ORAL
  Filled 2020-06-08 (×7): qty 1

## 2020-06-08 MED ORDER — ATORVASTATIN CALCIUM 40 MG PO TABS
80.0000 mg | ORAL_TABLET | Freq: Every day | ORAL | Status: DC
  Administered 2020-06-08 – 2020-06-14 (×7): 80 mg via ORAL
  Filled 2020-06-08 (×8): qty 2

## 2020-06-08 MED ORDER — FLUOXETINE HCL 20 MG PO CAPS
20.0000 mg | ORAL_CAPSULE | Freq: Every day | ORAL | Status: DC
  Administered 2020-06-08 – 2020-06-14 (×7): 20 mg via ORAL
  Filled 2020-06-08 (×8): qty 1

## 2020-06-08 MED ORDER — TRAZODONE HCL 50 MG PO TABS
25.0000 mg | ORAL_TABLET | Freq: Every day | ORAL | Status: DC
  Administered 2020-06-08 – 2020-06-13 (×6): 25 mg via ORAL
  Filled 2020-06-08 (×6): qty 1

## 2020-06-08 MED ORDER — RISPERIDONE 1 MG PO TABS
2.0000 mg | ORAL_TABLET | Freq: Two times a day (BID) | ORAL | Status: DC
  Administered 2020-06-08 (×2): 2 mg via ORAL
  Filled 2020-06-08 (×2): qty 2

## 2020-06-08 MED ORDER — ASPIRIN EC 81 MG PO TBEC
81.0000 mg | DELAYED_RELEASE_TABLET | Freq: Every day | ORAL | Status: DC
  Administered 2020-06-08 – 2020-06-14 (×7): 81 mg via ORAL
  Filled 2020-06-08 (×7): qty 1

## 2020-06-08 MED ORDER — HALOPERIDOL LACTATE 5 MG/ML IJ SOLN
3.0000 mg | Freq: Once | INTRAMUSCULAR | Status: AC
  Administered 2020-06-08: 3 mg via INTRAVENOUS
  Filled 2020-06-08: qty 1

## 2020-06-08 MED ORDER — HALOPERIDOL LACTATE 5 MG/ML IJ SOLN
2.0000 mg | Freq: Four times a day (QID) | INTRAMUSCULAR | Status: DC | PRN
  Administered 2020-06-08 – 2020-06-10 (×2): 2 mg via INTRAVENOUS
  Filled 2020-06-08 (×2): qty 1

## 2020-06-08 MED ORDER — RISAQUAD PO CAPS
1.0000 | ORAL_CAPSULE | Freq: Every day | ORAL | Status: DC
  Administered 2020-06-08 – 2020-06-14 (×7): 1 via ORAL
  Filled 2020-06-08 (×7): qty 1

## 2020-06-08 NOTE — Evaluation (Signed)
Clinical/Bedside Swallow Evaluation Patient Details  Name: Jamyria Ozanich MRN: [id number] Date of Birth: 10-12-1951  Today's Date: 06/08/2020 Time: SLP Start Time (ACUTE ONLY): 1103 SLP Stop Time (ACUTE ONLY): 1115 SLP Time Calculation (min) (ACUTE ONLY): 12 min  Past Medical History:  Past Medical History:  Diagnosis Date  . CKD (chronic kidney disease) stage 3, GFR 30-59 ml/min   . Diabetes mellitus (HCC)   . GAD (generalized anxiety disorder)   . HTN (hypertension)   . Schizoaffective disorder [facility])    Past Surgical History: The histories are not reviewed yet. Please review them in the "History" navigator section and refresh this SmartLink. HPI:  69 yo female with PMH of essential hypertension, CKDIII, depression, schizoaffective disorder, GAD, type 2 diabetes who presented from her SNF appearing altered and hypoxic. Head CT multiple remote infarcts with several infarcts having developed since remote exam- no acute infarct or hemorrhage. Chest x-ray raised concern for pneumonia. Per chart there was concern for aspiration.   Assessment / Plan / Recommendation Clinical Impression  Pt is safe to initiate a Dys 3 (chopped meat) texture and thin liquids. Stable baseline respirations, cough is weak and suspect possibly due to pain with pna. Cognitively appropriate for command following and comprehension during evaluation. Pt consumed 3 oz water via straw with cessation x 2 and quickly resumed to complete and an additional 1 ounce. No dyspnea, cough, throat clear or wet vocal quality. Pt's oral motor performance is functional and her lower teeth are flush with her gum but visible with questionable decay. She has one upper tooth and reports not eating meat. Mastication and transit with solid was timely and effective. Pt denies prior instances of dysphagia although at higher risk with remote strokes. Dys 3 texture, thin liquids, pills with water (RN reported no difficulty), straws allowed and stay  upright after meals given eructation during assessment. She is ataxic bilaterally and needed hand over hand for feeding- rec full supervision and assist. Plan for brief ST follow up. SLP Visit Diagnosis: Dysphagia, unspecified (R13.10)    Aspiration Risk  Mild aspiration risk    Diet Recommendation Dysphagia 3 (Mech soft);Thin liquid   Liquid Administration via: Cup;Straw Medication Administration: Whole meds with liquid Supervision: Staff to assist with self feeding;Full supervision/cueing for compensatory strategies Compensations: Slow rate;Small sips/bites;Lingual sweep for clearance of pocketing Postural Changes: Seated upright at 90 degrees;Remain upright for at least 30 minutes after po intake    Other  Recommendations Oral Care Recommendations: Oral care BID   Follow up Recommendations None      Frequency and Duration min 2x/week  2 weeks       Prognosis Prognosis for Safe Diet Advancement: Good      Swallow Study   General HPI: 69 yo female with PMH of essential hypertension, CKDIII, depression, schizoaffective disorder, GAD, type 2 diabetes who presented from her SNF appearing altered and hypoxic. Head CT multiple remote infarcts with several infarcts having developed since remote exam- no acute infarct or hemorrhage. Chest x-ray raised concern for pneumonia. Per chart there was concern for aspiration. Temperature Spikes Noted: No Respiratory Status: Nasal cannula History of Recent Intubation: No Behavior/Cognition: Alert;Cooperative;Pleasant mood Oral Cavity Assessment: Other (comment) (dental decay- ) Oral Care Completed by SLP: Recent completion by staff Oral Cavity - Dentition: Edentulous Vision: Impaired for self-feeding (left ptosis) Self-Feeding Abilities: Needs assist Patient Positioning: Upright in bed Baseline Vocal Quality: Normal Volitional Cough: Weak Volitional Swallow: Able to elicit    Oral/Motor/Sensory Function Overall Oral Motor/Sensory  Function: Within functional limits   Ice Chips Ice chips: Not tested   Thin Liquid Thin Liquid: Within functional limits Presentation: Cup;Straw    Nectar Thick Nectar Thick Liquid: Not tested   Honey Thick Honey Thick Liquid: Not tested   Puree Puree: Within functional limits   Solid     Solid: Within functional limits      Royce Macadamia 06/08/2020,11:35 AM  Breck Coons Lonell Face.Ed Nurse, children's [phone] Office [phone]

## 2020-06-08 NOTE — NC FL2 (Signed)
[place] MEDICAID FL2 LEVEL OF CARE SCREENING TOOL     IDENTIFICATION  Patient Name: Carrie Benson Birthdate: 07-09-1951 Sex: female Admission Date (Current Location): 06/07/2020  [facility] and [place] Number:  Producer, television/film/video and Address:  [facility],  [address], [place] [address]      Provider Number: [id number]  Attending Physician Name and Address:  Osvaldo Shipper, MD  Relative Name and Phone Number:       Current Level of Care: Hospital Recommended Level of Care: Skilled Nursing Facility Prior Approval Number:    Date Approved/Denied:   PASRR Number: [id number]  Discharge Plan: SNF    Current Diagnoses: Patient Active Problem List   Diagnosis Date Noted  . History of hip replacement 06/07/2020  . Aspiration pneumonia (HCC) 06/07/2020  . Acute respiratory failure with hypoxia (HCC) 06/07/2020  . Type 2 diabetes mellitus with stage 3 chronic kidney disease (HCC) 06/07/2020  . Acute renal failure superimposed on stage 3 chronic kidney disease (HCC) 06/07/2020    Orientation RESPIRATION BLADDER Height & Weight     Self, Time, Situation, Place  Normal Incontinent Weight: 95.3 kg Height:  5\' 3"  (160 cm)  BEHAVIORAL SYMPTOMS/MOOD NEUROLOGICAL BOWEL NUTRITION STATUS      Incontinent Diet (regular)  AMBULATORY STATUS COMMUNICATION OF NEEDS Skin   Total Care Verbally Normal                       Personal Care Assistance Level of Assistance  Bathing, Feeding, Dressing Bathing Assistance: Maximum assistance Feeding assistance: Maximum assistance Dressing Assistance: Maximum assistance     Functional Limitations Info  Sight, Hearing, Speech Sight Info: Adequate Hearing Info: Adequate Speech Info: Impaired    SPECIAL CARE FACTORS FREQUENCY  PT (By licensed PT)     PT Frequency: 5xweekly              Contractures Contractures Info: Not present    Additional Factors Info  Code Status Code Status Info: full              Current Medications (06/08/2020):  This is the current hospital active medication list Current Facility-Administered Medications  Medication Dose Route Frequency Provider Last Rate Last Admin  . 0.9 %  sodium chloride infusion   Intravenous Continuous 06/10/2020, MD   Paused at 06/08/20 0615  . acidophilus (RISAQUAD) capsule 1 capsule  1 capsule Oral Daily 06/10/20, MD   1 capsule at 06/08/20 1053  . ampicillin-sulbactam (UNASYN) 1.5 g in sodium chloride 0.9 % 100 mL IVPB  1.5 g Intravenous Q12H 06/10/20, MD   Stopped at 06/08/20 [phone]  . aspirin EC tablet 81 mg  81 mg Oral Daily 3846, MD   81 mg at 06/08/20 1053  . atorvastatin (LIPITOR) tablet 80 mg  80 mg Oral Daily 06/10/20, MD   80 mg at 06/08/20 1053  . benztropine (COGENTIN) tablet 0.5 mg  0.5 mg Oral BID 06/10/20, MD   0.5 mg at 06/08/20 1057  . chlorhexidine (PERIDEX) 0.12 % solution 15 mL  15 mL Mouth Rinse BID 06/10/20, MD   15 mL at 06/08/20 0924  . Chlorhexidine Gluconate Cloth 2 % PADS 6 each  6 each Topical Daily 06/10/20, MD   6 each at 06/07/20 2059  . FLUoxetine (PROZAC) capsule 20 mg  20 mg Oral Daily 2060, MD   20 mg at 06/08/20 1053  . heparin injection 5,000 Units  5,000  Units Subcutaneous Eliezer Lofts, MD   5,000 Units at 06/08/20 1318  . insulin aspart (novoLOG) injection 0-15 Units  0-15 Units Subcutaneous Q4H Lewie Chamber, MD      . MEDLINE mouth rinse  15 mL Mouth Rinse q12n4p Lewie Chamber, MD   15 mL at 06/08/20 1102  . nicotine (NICODERM CQ - dosed in mg/24 hours) patch 14 mg  14 mg Transdermal Daily Marikay Alar, FNP   14 mg at 06/08/20 3532  . pantoprazole (PROTONIX) EC tablet 40 mg  40 mg Oral Daily Osvaldo Shipper, MD   40 mg at 06/08/20 1053  . risperiDONE (RISPERDAL) tablet 2 mg  2 mg Oral BID Osvaldo Shipper, MD   2 mg at 06/08/20 1056  . traZODone (DESYREL) tablet 25 mg  25 mg Oral QHS Osvaldo Shipper, MD         Discharge  Medications: Please see discharge summary for a list of discharge medications.  Relevant Imaging Results:  Relevant Lab Results:   Additional Information DJM:[id number]  Golda Acre, RN

## 2020-06-08 NOTE — Progress Notes (Signed)
[facility] PROGRESS NOTE   Blenda Wisecup GEX:[id number] DOB: [birth year] DOA: 06/07/2020  PCP: Patient, No Pcp Per  Brief History/Interval Summary: 69 yo female with PMH of essential hypertension, CKDIII, depression, schizoaffective disorder, GAD, type 2 diabetes who presented from her SNF appearing altered and hypoxic.  She was reportedly only responsive to painful stimuli upon arrival.  She was requiring 10 L nonrebreather via EMS on arrival which improved to 4 L nasal cannula oxygen in the ER.    Chest x-ray raised concern for pneumonia.  There was concern for aspiration.  Patient was hospitalized for further management.  Reason for Visit: Aspiration pneumonia.  Acute respiratory failure with hypoxia  Consultants: None  Procedures: None yet  Antibiotics: Anti-infectives (From admission, onward)   Start     Dose/Rate Route Frequency Ordered Stop   06/07/20 2100  ampicillin-sulbactam (UNASYN) 1.5 g in sodium chloride 0.9 % 100 mL IVPB     Discontinue     1.5 g 200 mL/hr over 30 Minutes Intravenous Every 12 hours 06/07/20 2048     06/07/20 1845  vancomycin (VANCOREADY) IVPB 2000 mg/400 mL  Status:  Discontinued        2,000 mg 200 mL/hr over 120 Minutes Intravenous STAT 06/07/20 1834 06/07/20 2048   06/07/20 1830  vancomycin (VANCOCIN) 2,250 mg in sodium chloride 0.9 % 500 mL IVPB  Status:  Discontinued        2,250 mg 250 mL/hr over 120 Minutes Intravenous STAT 06/07/20 1817 06/07/20 1834   06/07/20 1800  ceFEPIme (MAXIPIME) 2 g in sodium chloride 0.9 % 100 mL IVPB  Status:  Discontinued        2 g 200 mL/hr over 30 Minutes Intravenous  Once 06/07/20 1750 06/07/20 2048      Subjective/Interval History: Patient seems to be somewhat distracted.  Seems to be disoriented.  Discussed with nursing staff.  Patient says that she had episodes of nausea vomiting last night but does not feel any nausea this morning.  Denies any abdominal pain.  Does have some difficulty breathing  but denies any cough.  Overall a poor historian.  She likely has underlying cognitive impairment.    Assessment/Plan:  Acute respiratory failure with hypoxia Patient currently saturating in the early 90s on 3 L of oxygen by nasal cannula.  Yesterday in the emergency department she was requiring nonrebreather briefly.  She seems to be stable from a respiratory standpoint.  Continue treatment for aspiration pneumonia.  Aspiration pneumonia Patient noted to have poor dentition.  There is also concern for oropharyngeal dysphagia.  Speech therapy has been consulted.  Continue Unasyn.  Procalcitonin less than 0.1.  Acute renal failure on chronic kidney disease stage IIIb Baseline creatinine around 1.8.  Came in with a creatinine of 2.7.  When it was rechecked it was noted to be 3.0.  Patient has been hydrated.  Creatinine is slightly better at 2.33 today with a BUN of 29.  Monitor urine output.  UA reviewed.  Diabetes mellitus type 2, with renal complications, with chronic kidney disease stage IIIb Monitor CBGs.  SSI.  HbA1c 6.8.  At SNF she is noted to be on Humalog.  Acute metabolic encephalopathy Baseline mental status is not clearly known.  She does have a history of schizoaffective disorder and depression.  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  Does not have any focal neurological deficits.  Infection likely contributing to encephalopathy.  Continue to monitor.  Essential hypertension Noted to be on amlodipine and metoprolol  prior to admission.  Holding blood pressure medications.  History of schizoaffective disorder/depression Prior to admission she was noted to be on benztropine, fluoxetine, Risperdal and trazodone.  These medications are on hold due to her encephalopathy.  Obesity Estimated body mass index is 37.2 kg/m as calculated from the following:   Height as of this encounter: 5\' 3"  (1.6 m).   Weight as of this encounter: 95.3 kg.   DVT Prophylaxis: Subcutaneous heparin Code  Status: Full code Family Communication: No family at bedside Disposition Plan: From skilled nursing facility.  Hopefully back to the same level of care when improved.  Status is: Inpatient  Remains inpatient appropriate because:Altered mental status and IV treatments appropriate due to intensity of illness or inability to take PO   Dispo: The patient is from: SNF              Anticipated d/c is to: SNF              Anticipated d/c date is: 2 days              Patient currently is not medically stable to d/c.      Medications:  Scheduled: . chlorhexidine  15 mL Mouth Rinse BID  . Chlorhexidine Gluconate Cloth  6 each Topical Daily  . heparin  5,000 Units Subcutaneous Q8H  . insulin aspart  0-15 Units Subcutaneous Q4H  . mouth rinse  15 mL Mouth Rinse q12n4p  . nicotine  14 mg Transdermal Daily   Continuous: . sodium chloride Stopped (06/08/20 0615)  . ampicillin-sulbactam (UNASYN) IV Stopped (06/08/20 0918)   PRN:   Objective:  Vital Signs  Vitals:   06/08/20 0700 06/08/20 0800 06/08/20 0900 06/08/20 1000  BP: (!) 149/97 (!) 156/74 (!) 149/88 (!) 146/116  Pulse: 72 69 73 71  Resp: (!) 23 12 17  (!) 27  Temp:  97.6 F (36.4 C)    TempSrc:  Oral    SpO2: (!) 86% (!) 81% (!) 88% (!) 84%  Weight:      Height:        Intake/Output Summary (Last 24 hours) at 06/08/2020 1003 Last data filed at 06/08/2020 0600 Gross per 24 hour  Intake 879.25 ml  Output 350 ml  Net 529.25 ml   Filed Weights   06/07/20 1812 06/07/20 1830  Weight: 108.9 kg 95.3 kg    General appearance: Awake alert.  In no distress.  Distracted Resp: Mildly tachypneic.  No use of accessory muscles.  Crackles bilateral bases.  No wheezing or rhonchi.  Cardio: S1-S2 is normal regular.  No S3-S4.  No rubs murmurs or bruit GI: Abdomen is soft.  Nontender nondistended.  Bowel sounds are present normal.  No masses organomegaly Extremities: No edema.  Full range of motion of lower  extremities. Neurologic: Awake alert.  Distracted.  No facial asymmetry.  Motor strength equal bilateral upper and lower extremity.   Lab Results:  Data Reviewed: I have personally reviewed following labs and imaging studies  CBC: Recent Labs  Lab 06/07/20 1612 06/07/20 1716  WBC 6.8  --   NEUTROABS 4.3  --   HGB 12.5 12.9  HCT 40.6 38.0  MCV 100.5*  --   PLT 172  --     Basic Metabolic Panel: Recent Labs  Lab 06/07/20 1612 06/07/20 1716 06/08/20 0258  NA 142 142 142  K 4.4 5.4* 4.5  CL 103 104 108  CO2 28  --  23  GLUCOSE 146* 140* 87  BUN 31* 41* 29*  CREATININE 2.70* 3.00* 2.33*  CALCIUM 9.2  --  8.8*  MG  --   --  2.0    GFR: Estimated Creatinine Clearance: 25.4 mL/min (A) (by C-G formula based on SCr of 2.33 mg/dL (H)).  Liver Function Tests: Recent Labs  Lab 06/07/20 1612  AST 12*  ALT 10  ALKPHOS 60  BILITOT 0.3  PROT 6.8  ALBUMIN 3.5    Coagulation Profile: Recent Labs  Lab 06/07/20 1612  INR 0.9    HbA1C: Recent Labs    06/07/20 2129  HGBA1C 6.8*    CBG: Recent Labs  Lab 06/07/20 1747 06/07/20 2153 06/07/20 2334 06/08/20 0352 06/08/20 0745  GLUCAP 107* 89 77 94 92     Recent Results (from the past 240 hour(s))  SARS Coronavirus 2 by RT PCR (hospital order, performed in [facility] hospital lab) Nasopharyngeal Nasopharyngeal Swab     Status: None   Collection Time: 06/07/20  5:56 PM   Specimen: Nasopharyngeal Swab  Result Value Ref Range Status   SARS Coronavirus 2 NEGATIVE NEGATIVE Final    Comment: (NOTE) SARS-CoV-2 target nucleic acids are NOT DETECTED.  The SARS-CoV-2 RNA is generally detectable in upper and lower respiratory specimens during the acute phase of infection. The lowest concentration of SARS-CoV-2 viral copies this assay can detect is 250 copies / mL. A negative result does not preclude SARS-CoV-2 infection and should not be used as the sole basis for treatment or other patient management decisions.   A negative result may occur with improper specimen collection / handling, submission of specimen other than nasopharyngeal swab, presence of viral mutation(s) within the areas targeted by this assay, and inadequate number of viral copies (<250 copies / mL). A negative result must be combined with clinical observations, patient history, and epidemiological information.  Fact Sheet for Patients:   BoilerBrush.com.cy  Fact Sheet for Healthcare Providers: https://pope.com/  This test is not yet approved or  cleared by the [place] FDA and has been authorized for detection and/or diagnosis of SARS-CoV-2 by FDA under an Emergency Use Authorization (EUA).  This EUA will remain in effect (meaning this test can be used) for the duration of the COVID-19 declaration under Section 564(b)(1) of the Act, 21 U.S.C. section 360bbb-3(b)(1), unless the authorization is terminated or revoked sooner.  Performed at [facility], [address], [place], [place] [address]   MRSA PCR Screening     Status: None   Collection Time: 06/07/20  8:48 PM   Specimen: Nasal Mucosa; Nasopharyngeal  Result Value Ref Range Status   MRSA by PCR NEGATIVE NEGATIVE Final    Comment:        The GeneXpert MRSA Assay (FDA approved for NASAL specimens only), is one component of a comprehensive MRSA colonization surveillance program. It is not intended to diagnose MRSA infection nor to guide or monitor treatment for MRSA infections. Performed at [facility], [address], [place], [place] [address]       Radiology Studies: CT Head Wo Contrast  Result Date: 06/07/2020 CLINICAL DATA:  Altered mental status EXAM: CT HEAD WITHOUT CONTRAST TECHNIQUE: Contiguous axial images were obtained from the base of the skull through the vertex without intravenous contrast. COMPARISON:  08/28/2017 FINDINGS: Brain: Encephalomalacia is again  seen within the a para falcine right frontal lobe, left corona radiata and cerebellar hemispheres bilaterally in keeping with remote infarcts. Since the prior examination, there has developed encephalomalacia within the right  parietal lobe, left para falcine occipital lobe, and posterior left temporal lobe in keeping with remote infarcts, though new from prior examination. There is no evidence of acute intracranial hemorrhage or infarct. Moderate periventricular white matter changes are present likely reflecting the sequela of small vessel ischemia. No abnormal mass effect or midline shift. No abnormal intra or extra-axial mass lesion or fluid collection. The ventricular size is normal. Vascular: No hyperdense vasculature noted at the skull base. Extensive atherosclerotic calcification is seen within the carotid siphons. Skull: Intact Sinuses/Orbits: The paranasal sinuses are clear. 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 Right orbit is unremarkable. Other: Mastoid air cells and middle ear cavities are clear. Subcutaneous soft tissue nodule within the frontal scalp, axial image # 21/2, is indeterminate, but enlarged since prior examination measuring 11 mm. No erosion of the subjacent calvarium. 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 No evidence of acute intracranial hemorrhage or infarct, however. Electronically Signed   By: Helyn Numbers MD   On: 06/07/2020 18:40   DG Chest Port 1 View  Result Date: 06/07/2020 CLINICAL DATA:  Altered mental status EXAM: PORTABLE CHEST 1 VIEW COMPARISON:  05/29/2020 FINDINGS: Bibasilar pulmonary infiltrates have developed, infection versus aspiration. No pneumothorax or pleural effusion. Cardiac size within normal limits. Pulmonary vascularity is normal. No acute bone abnormality. IMPRESSION: Interval development of bibasilar pulmonary infiltrates, infection versus aspiration. Electronically Signed   By:  Helyn Numbers MD   On: 06/07/2020 18:05       LOS: 1 day   Osvaldo Shipper  [facility] Pager on [email or web address]  06/08/2020, 10:03 AM

## 2020-06-08 NOTE — TOC Initial Note (Signed)
Transition of Care [facility]) - Initial/Assessment Note    Patient Details  Name: Rosaria Kubin MRN: [id number] Date of Birth: 01/09/1951  [facility] CM/SW Contact:    Golda Acre, RN Phone Number: 06/08/2020, 3:24 PM  Clinical Narrative:                 Patient is from [place] snf.  Will return to [place].  Fl2 sent to [facility].  Expected Discharge Plan: Skilled Nursing Facility Barriers to Discharge: Continued Medical Work up   Patient Goals and CMS Choice Patient states their goals for this hospitalization and ongoing recovery are:: not able to state no family in room. CMS Medicare.gov Compare Post Acute Care list provided to:: Patient    Expected Discharge Plan and Services Expected Discharge Plan: Skilled Nursing Facility   Discharge Planning Services: CM Consult   Living arrangements for the past 2 months: Skilled Nursing Facility                                      Prior Living Arrangements/Services Living arrangements for the past 2 months: Skilled Nursing Facility Lives with:: Facility Resident [facility]) Patient language and need for interpreter reviewed:: Yes        Need for Family Participation in Patient Care: Yes (Comment) Care giver support system in place?: Yes (comment)   Criminal Activity/Legal Involvement Pertinent to Current Situation/Hospitalization: No - Comment as needed  Activities of Daily Living Home Assistive Devices/Equipment: None (per pt) ADL Screening (condition at time of admission) Patient's cognitive ability adequate to safely complete daily activities?: No Is the patient deaf or have difficulty hearing?: No Does the patient have difficulty seeing, even when wearing glasses/contacts?: No Does the patient have difficulty concentrating, remembering, or making decisions?: Yes Patient able to express need for assistance with ADLs?: Yes Does the patient have difficulty dressing or bathing?:  No Independently performs ADLs?: No Communication: Independent Dressing (OT): Independent Grooming: Independent Feeding: Independent Bathing: Needs assistance Is this a change from baseline?: Pre-admission baseline Toileting: Needs assistance Is this a change from baseline?: Pre-admission baseline In/Out Bed: Needs assistance Is this a change from baseline?: Pre-admission baseline Walks in Home: Needs assistance Is this a change from baseline?: Pre-admission baseline Does the patient have difficulty walking or climbing stairs?: Yes Weakness of Legs: Both Weakness of Arms/Hands: Both  Permission Sought/Granted                  Emotional Assessment Appearance:: Appears older than stated age     Orientation: : Oriented to Self, Oriented to Place, Oriented to  Time, Oriented to Situation Alcohol / Substance Use: Not Applicable Psych Involvement: No (comment)  Admission diagnosis:  History of hip replacement [Z96.649] Aspiration pneumonia (HCC) [J69.0] Patient Active Problem List   Diagnosis Date Noted  . History of hip replacement 06/07/2020  . Aspiration pneumonia (HCC) 06/07/2020  . Acute respiratory failure with hypoxia (HCC) 06/07/2020  . Type 2 diabetes mellitus with stage 3 chronic kidney disease (HCC) 06/07/2020  . Acute renal failure superimposed on stage 3 chronic kidney disease (HCC) 06/07/2020   PCP:  Patient, No Pcp Per Pharmacy:  No Pharmacies Listed    Social Determinants of Health (SDOH) Interventions    Readmission Risk Interventions No flowsheet data found.

## 2020-06-09 ENCOUNTER — Inpatient Hospital Stay (HOSPITAL_COMMUNITY): Payer: Medicare HMO

## 2020-06-09 ENCOUNTER — Other Ambulatory Visit: Payer: Self-pay

## 2020-06-09 DIAGNOSIS — G9341 Metabolic encephalopathy: Secondary | ICD-10-CM

## 2020-06-09 LAB — BASIC METABOLIC PANEL
Anion gap: 10 (ref 5–15)
BUN: 22 mg/dL (ref 8–23)
CO2: 24 mmol/L (ref 22–32)
Calcium: 8.9 mg/dL (ref 8.9–10.3)
Chloride: 103 mmol/L (ref 98–111)
Creatinine, Ser: 1.79 mg/dL — ABNORMAL HIGH (ref 0.44–1.00)
GFR calc Af Amer: 33 mL/min — ABNORMAL LOW (ref >60)
GFR calc non Af Amer: 29 mL/min — ABNORMAL LOW (ref >60)
Glucose, Bld: 118 mg/dL — ABNORMAL HIGH (ref 70–99)
Potassium: 3.7 mmol/L (ref 3.5–5.1)
Sodium: 137 mmol/L (ref 135–145)

## 2020-06-09 LAB — GLUCOSE, CAPILLARY
Glucose-Capillary: 91 mg/dL (ref 70–99)
Glucose-Capillary: 102 mg/dL — ABNORMAL HIGH (ref 70–99)
Glucose-Capillary: 111 mg/dL — ABNORMAL HIGH (ref 70–99)
Glucose-Capillary: 115 mg/dL — ABNORMAL HIGH (ref 70–99)
Glucose-Capillary: 106 mg/dL — ABNORMAL HIGH (ref 70–99)

## 2020-06-09 LAB — URINE CULTURE: Culture: 4000 — AB

## 2020-06-09 LAB — CBC
HCT: 37.6 % (ref 36.0–46.0)
Hemoglobin: 12.2 g/dL (ref 12.0–15.0)
MCH: 31.8 pg (ref 26.0–34.0)
MCHC: 32.4 g/dL (ref 30.0–36.0)
MCV: 97.9 fL (ref 80.0–100.0)
Platelets: 181 10*3/uL (ref 150–400)
RBC: 3.84 MIL/uL — ABNORMAL LOW (ref 3.87–5.11)
RDW: 13.2 % (ref 11.5–15.5)
WBC: 7 10*3/uL (ref 4.0–10.5)
nRBC: 0 % (ref 0.0–0.2)

## 2020-06-09 LAB — PROCALCITONIN: Procalcitonin: <0.1 ng/mL

## 2020-06-09 MED ORDER — METOPROLOL TARTRATE 25 MG PO TABS
25.0000 mg | ORAL_TABLET | Freq: Two times a day (BID) | ORAL | Status: DC
  Administered 2020-06-09 – 2020-06-14 (×11): 25 mg via ORAL
  Filled 2020-06-09 (×12): qty 1

## 2020-06-09 MED ORDER — QUETIAPINE FUMARATE 50 MG PO TABS
50.0000 mg | ORAL_TABLET | Freq: Two times a day (BID) | ORAL | Status: DC
  Administered 2020-06-09 – 2020-06-14 (×11): 50 mg via ORAL
  Filled 2020-06-09 (×11): qty 1

## 2020-06-09 MED ORDER — AMLODIPINE BESYLATE 10 MG PO TABS
10.0000 mg | ORAL_TABLET | Freq: Every day | ORAL | Status: DC
  Administered 2020-06-09 – 2020-06-14 (×6): 10 mg via ORAL
  Filled 2020-06-09 (×6): qty 1

## 2020-06-09 MED ORDER — SODIUM CHLORIDE 0.9 % IV SOLN
3.0000 g | Freq: Four times a day (QID) | INTRAVENOUS | Status: DC
  Administered 2020-06-09 – 2020-06-10 (×4): 3 g via INTRAVENOUS
  Filled 2020-06-09: qty 8
  Filled 2020-06-09 (×3): qty 3
  Filled 2020-06-09: qty 8

## 2020-06-09 NOTE — Progress Notes (Signed)
[facility] PROGRESS NOTE   Carrie Benson QIW:[id number] DOB: 1950/12/23 DOA: 06/07/2020  PCP: Patient, No Pcp Per  Brief History/Interval Summary: 69 yo female with PMH of essential hypertension, CKDIII, depression, schizoaffective disorder, GAD, type 2 diabetes who presented from her SNF appearing altered and hypoxic.  She was reportedly only responsive to painful stimuli upon arrival.  She was requiring 10 L nonrebreather via EMS on arrival which improved to 4 L nasal cannula oxygen in the ER.    Chest x-ray raised concern for pneumonia.  There was concern for aspiration.  Patient was hospitalized for further management.  Reason for Visit: Aspiration pneumonia.  Acute respiratory failure with hypoxia  Consultants: None  Procedures: None yet  Antibiotics: Anti-infectives (From admission, onward)   Start     Dose/Rate Route Frequency Ordered Stop   06/07/20 2100  ampicillin-sulbactam (UNASYN) 1.5 g in sodium chloride 0.9 % 100 mL IVPB     Discontinue     1.5 g 200 mL/hr over 30 Minutes Intravenous Every 12 hours 06/07/20 2048     06/07/20 1845  vancomycin (VANCOREADY) IVPB 2000 mg/400 mL  Status:  Discontinued        2,000 mg 200 mL/hr over 120 Minutes Intravenous STAT 06/07/20 1834 06/07/20 2048   06/07/20 1830  vancomycin (VANCOCIN) 2,250 mg in sodium chloride 0.9 % 500 mL IVPB  Status:  Discontinued        2,250 mg 250 mL/hr over 120 Minutes Intravenous STAT 06/07/20 1817 06/07/20 1834   06/07/20 1800  ceFEPIme (MAXIPIME) 2 g in sodium chloride 0.9 % 100 mL IVPB  Status:  Discontinued        2 g 200 mL/hr over 30 Minutes Intravenous  Once 06/07/20 1750 06/07/20 2048      Subjective/Interval History: Patient apparently became agitated overnight. The dose of Haldol had to be increased. Patient seems to be calmer this morning. Though she is in restraints. Denies any complaints.     Assessment/Plan:  Acute respiratory failure with hypoxia This is secondary to  aspiration pneumonia. Patient's oxygen saturations are stable. Oxygen is being weaned down. She appears to be stable from a respiratory standpoint.   Aspiration pneumonia Patient noted to have poor dentition.  There is also concern for oropharyngeal dysphagia.  Speech therapy is following. She is on a dysphagia diet. Continue Unasyn for another 24 hours with plans to change to oral antibiotics tomorrow. Procalcitonin less than 0.1. Chest x-ray done this morning showed stable findings.  Acute renal failure on chronic kidney disease stage IIIb Baseline creatinine around 1.8.  Came in with a creatinine of 2.7.  When it was rechecked it was noted to be 3.0. Patient has been hydrated. Creatinine noted to be 1.79 this morning. Stop IV fluids. Monitor urine output.   Diabetes mellitus type 2, with renal complications, with chronic kidney disease stage IIIb Monitor CBGs.  SSI.  HbA1c 6.8.  At SNF she is noted to be on Humalog.  Acute metabolic encephalopathy Baseline mental status is not clearly known.  She does have a history of schizoaffective disorder and depression.  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  Does not have any focal neurological deficits.  Infection likely contributing to encephalopathy. Noted to be agitated overnight. She is noted to be on Risperdal. We will change her to Seroquel. Check EKG for QTC. She is also on Haldol as needed. Try to remove restraints today. EKG was done and shows QTC to be 440.  Essential hypertension Noted to  be on amlodipine and metoprolol prior to admission. Blood pressure medicines were placed on hold at admission. She is noted to be hypertensive most likely due to agitation. We will resume her antihypertensives.  History of schizoaffective disorder/depression Prior to admission she was noted to be on benztropine, fluoxetine, Risperdal and trazodone. Psychotropic medications were restarted yesterday. We'll switch out the Risperdal to Seroquel. Noted to be agitated as  mentioned above.  Obesity Estimated body mass index is 37.2 kg/m as calculated from the following:   Height as of this encounter:  (1.6 m).   Weight as of this encounter: 95.3 kg.   DVT Prophylaxis: Subcutaneous heparin Code Status: Full code Family Communication: No family at bedside Disposition Plan: From skilled nursing facility.  Hopefully back to the same level of care when improved.  Status is: Inpatient  Remains inpatient appropriate because:IV treatments appropriate due to intensity of illness or inability to take PO   Dispo: The patient is from: SNF              Anticipated d/c is to: SNF              Anticipated d/c date is: 2 days              Patient currently is not medically stable to d/c.      Medications:  Scheduled: . acidophilus  1 capsule Oral Daily  . aspirin EC  81 mg Oral Daily  . atorvastatin  80 mg Oral Daily  . benztropine  0.5 mg Oral BID  . chlorhexidine  15 mL Mouth Rinse BID  . Chlorhexidine Gluconate Cloth  6 each Topical Daily  . FLUoxetine  20 mg Oral Daily  . heparin  5,000 Units Subcutaneous Q8H  . insulin aspart  0-15 Units Subcutaneous Q4H  . mouth rinse  15 mL Mouth Rinse q12n4p  . nicotine  14 mg Transdermal Daily  . pantoprazole  40 mg Oral Daily  . QUEtiapine  50 mg Oral BID  . traZODone  25 mg Oral QHS   Continuous: . sodium chloride 100 mL/hr at 06/08/20 1817  . ampicillin-sulbactam (UNASYN) IV Stopped (06/09/20 0935)   PRN:   Objective:  Vital Signs  Vitals:   06/09/20 0440 06/09/20 0600 06/09/20 0700 06/09/20 0800  BP:  (!) 179/76 (!) 154/121 (!) 167/77  Pulse:  76 81 77  Resp:  15 (!) 24 12  Temp: 98.7 F (37.1 C)   97.7 F (36.5 C)  TempSrc: Axillary   Oral  SpO2:  97% 96% 98%  Weight:      Height:        Intake/Output Summary (Last 24 hours) at 06/09/2020 1023 Last data filed at 06/09/2020 0500 Gross per 24 hour  Intake 609.33 ml  Output 900 ml  Net -290.67 ml   Filed Weights   06/07/20  1812 06/07/20 1830  Weight: 108.9 kg 95.3 kg    General appearance: Noted to be distracted but awake alert. Following commands. Resp: Normal effort noted today. Few crackles bilateral bases. No wheezing or rhonchi.  Cardio: S1-S2 is normal regular.  No S3-S4.  No rubs murmurs or bruit GI: Abdomen is soft.  Nontender nondistended.  Bowel sounds are present normal.  No masses organomegaly Extremities: No edema. Moving all extremities. Neurologic: Awake alert. Oriented to city but not to year or month. No obvious focal deficits.   Lab Results:  Data Reviewed: I have personally reviewed following labs and imaging studies  CBC: Recent  Labs  Lab 06/07/20 1612 06/07/20 1716 06/09/20 0922  WBC 6.8  --  7.0  NEUTROABS 4.3  --   --   HGB 12.5 12.9 12.2  HCT 40.6 38.0 37.6  MCV 100.5*  --  97.9  PLT 172  --  181    Basic Metabolic Panel: Recent Labs  Lab 06/07/20 1612 06/07/20 1716 06/08/20 0258 06/09/20 0922  NA 142 142 142 137  K 4.4 5.4* 4.5 3.7  CL 103 104 108 103  CO2 28  --  23 24  GLUCOSE 146* 140* 87 118*  BUN 31* 41* 29* 22  CREATININE 2.70* 3.00* 2.33* 1.79*  CALCIUM 9.2  --  8.8* 8.9  MG  --   --  2.0  --     GFR: Estimated Creatinine Clearance: 33.1 mL/min (A) (by C-G formula based on SCr of 1.79 mg/dL (H)).  Liver Function Tests: Recent Labs  Lab 06/07/20 1612  AST 12*  ALT 10  ALKPHOS 60  BILITOT 0.3  PROT 6.8  ALBUMIN 3.5    Coagulation Profile: Recent Labs  Lab 06/07/20 1612  INR 0.9    HbA1C: Recent Labs    06/07/20 2129  HGBA1C 6.8*    CBG: Recent Labs  Lab 06/08/20 1525 06/08/20 1957 06/08/20 2356 06/09/20 0449 06/09/20 0749  GLUCAP 102* 126* 112* 91 102*     Recent Results (from the past 240 hour(s))  Blood Culture (routine x 2)     Status: None (Preliminary result)   Collection Time: 06/07/20  4:12 PM   Specimen: BLOOD  Result Value Ref Range Status   Specimen Description   Final    BLOOD LEFT ARM Performed at  [facility], [address] [place], [place] [address]    Special Requests   Final    BOTTLES DRAWN AEROBIC AND ANAEROBIC Blood Culture adequate volume Performed at [facility], [address] [place], [place] [address]    Culture   Final    NO GROWTH 2 DAYS Performed at [facility], [address] [place], [place] [address]    Report Status PENDING  Incomplete  SARS Coronavirus 2 by RT PCR (hospital order, performed in [facility] hospital lab) Nasopharyngeal Nasopharyngeal Swab     Status: None   Collection Time: 06/07/20  5:56 PM   Specimen: Nasopharyngeal Swab  Result Value Ref Range Status   SARS Coronavirus 2 NEGATIVE NEGATIVE Final    Comment: (NOTE) SARS-CoV-2 target nucleic acids are NOT DETECTED.  The SARS-CoV-2 RNA is generally detectable in upper and lower respiratory specimens during the acute phase of infection. The lowest concentration of SARS-CoV-2 viral copies this assay can detect is 250 copies / mL. A negative result does not preclude SARS-CoV-2 infection and should not be used as the sole basis for treatment or other patient management decisions.  A negative result may occur with improper specimen collection / handling, submission of specimen other than nasopharyngeal swab, presence of viral mutation(s) within the areas targeted by this assay, and inadequate number of viral copies (<250 copies / mL). A negative result must be combined with clinical observations, patient history, and epidemiological information.  Fact Sheet for Patients:   BoilerBrush.com.cyhttps://www.fda.gov/media/136312/download  Fact Sheet for Healthcare Providers: https://pope.com/https://www.fda.gov/media/136313/download  This test is not yet approved or  cleared by the [place] FDA and has been authorized for detection and/or diagnosis of SARS-CoV-2 by FDA under an Emergency Use Authorization (EUA).  This EUA will remain in  effect (meaning this test can be used)  for the duration of the COVID-19 declaration under Section 564(b)(1) of the Act, 21 U.S.C. section 360bbb-3(b)(1), unless the authorization is terminated or revoked sooner.  Performed at [facility], [address] [place], [place] [address]   Urine culture     Status: None (Preliminary result)   Collection Time: 06/07/20  6:30 PM   Specimen: In/Out Cath Urine  Result Value Ref Range Status   Specimen Description   Final    IN/OUT CATH URINE Performed at [facility], [address], [place], [place] [address]    Special Requests   Final    NONE Performed at [facility], [address] [place], [place] [address]    Culture   Final    CULTURE REINCUBATED FOR BETTER GROWTH Performed at [facility], [address], [place], [place] [address]    Report Status PENDING  Incomplete  MRSA PCR Screening     Status: None   Collection Time: 06/07/20  8:48 PM   Specimen: Nasal Mucosa; Nasopharyngeal  Result Value Ref Range Status   MRSA by PCR NEGATIVE NEGATIVE Final    Comment:        The GeneXpert MRSA Assay (FDA approved for NASAL specimens only), is one component of a comprehensive MRSA colonization surveillance program. It is not intended to diagnose MRSA infection nor to guide or monitor treatment for MRSA infections. Performed at [facility], [address], [place], [place] [address]   Blood Culture (routine x 2)     Status: None (Preliminary result)   Collection Time: 06/07/20  9:29 PM   Specimen: BLOOD RIGHT ARM  Result Value Ref Range Status   Specimen Description   Final    BLOOD RIGHT ARM Performed at [facility], [address], [place], [place] [address]    Special Requests   Final    BOTTLES DRAWN AEROBIC ONLY Blood Culture adequate volume Performed at [facility], [address], [place], [place] [address]    Culture   Final    NO  GROWTH 2 DAYS Performed at [facility], [address] [place], [place] [address]    Report Status PENDING  Incomplete      Radiology Studies: CT Head Wo Contrast  Result Date: 06/07/2020 CLINICAL DATA:  Altered mental status EXAM: CT HEAD WITHOUT CONTRAST TECHNIQUE: Contiguous axial images were obtained from the base of the skull through the vertex without intravenous contrast. COMPARISON:  08/28/2017 FINDINGS: Brain: Encephalomalacia is again seen within the a para falcine right frontal lobe, left corona radiata and cerebellar hemispheres bilaterally in keeping with remote infarcts. Since the prior examination, there has developed encephalomalacia within the right parietal lobe, left para falcine occipital lobe, and posterior left temporal lobe in keeping with remote infarcts, though new from prior examination. There is no evidence of acute intracranial hemorrhage or infarct. Moderate periventricular white matter changes are present likely reflecting the sequela of small vessel ischemia. No abnormal mass effect or midline shift. No abnormal intra or extra-axial mass lesion or fluid collection. The ventricular size is normal. Vascular: No hyperdense vasculature noted at the skull base. Extensive atherosclerotic calcification is seen within the carotid siphons. Skull: Intact Sinuses/Orbits: The paranasal sinuses are clear. 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 Right orbit is unremarkable. Other: Mastoid air cells and middle ear cavities are clear. Subcutaneous soft tissue nodule within the frontal  scalp, axial image # 21/2, is indeterminate, but enlarged since prior examination measuring 11 mm. No erosion of the subjacent calvarium. 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 No evidence of acute intracranial hemorrhage or infarct, however. Electronically Signed   By: Helyn Numbers MD   On: 06/07/2020 18:40   DG CHEST PORT 1  VIEW  Result Date: 06/09/2020 CLINICAL DATA:  Hypoxia.  Follow-up pneumonia. EXAM: PORTABLE CHEST 1 VIEW COMPARISON:  06/07/2020. FINDINGS: Mediastinum hilar structures normal. Persistent bibasilar atelectasis/infiltrates with slight improvement in aeration from prior exam. No pleural effusion or pneumothorax. Heart size stable. No acute bony abnormality. IMPRESSION: Persistent bibasilar atelectasis/infiltrates with slight improvement in aeration from prior exam. Electronically Signed   By: Maisie Fus  Register   On: 06/09/2020 06:56   DG Chest Port 1 View  Result Date: 06/07/2020 CLINICAL DATA:  Altered mental status EXAM: PORTABLE CHEST 1 VIEW COMPARISON:  05/29/2020 FINDINGS: Bibasilar pulmonary infiltrates have developed, infection versus aspiration. No pneumothorax or pleural effusion. Cardiac size within normal limits. Pulmonary vascularity is normal. No acute bone abnormality. IMPRESSION: Interval development of bibasilar pulmonary infiltrates, infection versus aspiration. Electronically Signed   By: Helyn Numbers MD   On: 06/07/2020 18:05       LOS: 2 days   Osvaldo Shipper  [facility] Pager on [email or web address]  06/09/2020, 10:23 AM

## 2020-06-09 NOTE — Plan of Care (Signed)
  Problem: Education: Goal: Knowledge of General Education information will improve Description: Including pain rating scale, medication(s)/side effects and non-pharmacologic comfort measures Outcome: Not Progressing   Problem: Health Behavior/Discharge Planning: Goal: Ability to manage health-related needs will improve Outcome: Not Progressing   Problem: Nutrition: Goal: Adequate nutrition will be maintained Outcome: Progressing   Problem: Coping: Goal: Level of anxiety will decrease Outcome: Not Progressing

## 2020-06-09 NOTE — Progress Notes (Signed)
  Speech Language Pathology Treatment: Dysphagia  Patient Details Name: Carrie Benson MRN: [id number] DOB: 1951-05-22 Today's Date: 06/09/2020 Time: 1250-1300 SLP Time Calculation (min) (ACUTE ONLY): 10 min  Assessment / Plan / Recommendation Clinical Impression  Pt spit out peach from fruit cocktail that she could not masticate after she attempted to masticate over approx 6 minutes.   NT reports pt needing liquids to swallow partially masticated foods.  SLP will modify diet to dys2/thin to compensate for her oral dysphagia and decrease aspiration risk of solids that are not adequately masticated. Oral transiting and tolerance of pudding was much improved over solids.  No indication of aspiration with po and delay not present with consumption of liquids.  Will follow up next week to assure po tolerance, readiness for dietary advancement, etc.  Pt informed of plan but uncertain to her ability to follow directions/comprehend care plan.    HPI HPI: 69 yo female with PMH of essential hypertension, CKDIII, depression, schizoaffective disorder, GAD, type 2 diabetes who presented from her SNF appearing altered and hypoxic. Head CT multiple remote infarcts with several infarcts having developed since remote exam- no acute infarct or hemorrhage. Chest x-ray raised concern for pneumonia. Per chart there was concern for aspiration.      SLP Plan  Continue with current plan of care       Recommendations  Diet recommendations: Dysphagia 2 (fine chop);Thin liquid Liquids provided via: Straw;Cup Medication Administration: Whole meds with liquid (with puree if problematic) Compensations: Slow rate;Small sips/bites;Lingual sweep for clearance of pocketing Postural Changes and/or Swallow Maneuvers: Seated upright 90 degrees;Upright 30-60 min after meal                Oral Care Recommendations: Oral care BID Follow up Recommendations: None SLP Visit Diagnosis: Dysphagia, unspecified (R13.10) Plan:  Continue with current plan of care       GO              Rolena Infante, MS [facility] SLP Acute Rehab Services Office [phone]   Chales Abrahams 06/09/2020, 4:06 PM

## 2020-06-10 ENCOUNTER — Other Ambulatory Visit: Payer: Self-pay

## 2020-06-10 ENCOUNTER — Encounter (HOSPITAL_COMMUNITY): Payer: Self-pay | Admitting: Internal Medicine

## 2020-06-10 LAB — GLUCOSE, CAPILLARY
Glucose-Capillary: 100 mg/dL — ABNORMAL HIGH (ref 70–99)
Glucose-Capillary: 128 mg/dL — ABNORMAL HIGH (ref 70–99)
Glucose-Capillary: 168 mg/dL — ABNORMAL HIGH (ref 70–99)
Glucose-Capillary: 70 mg/dL (ref 70–99)
Glucose-Capillary: 74 mg/dL (ref 70–99)
Glucose-Capillary: 92 mg/dL (ref 70–99)
Glucose-Capillary: 92 mg/dL (ref 70–99)

## 2020-06-10 LAB — CBC
HCT: 38.6 % (ref 36.0–46.0)
Hemoglobin: 12.3 g/dL (ref 12.0–15.0)
MCH: 31.6 pg (ref 26.0–34.0)
MCHC: 31.9 g/dL (ref 30.0–36.0)
MCV: 99.2 fL (ref 80.0–100.0)
Platelets: 165 10*3/uL (ref 150–400)
RBC: 3.89 MIL/uL (ref 3.87–5.11)
RDW: 13.2 % (ref 11.5–15.5)
WBC: 7.8 10*3/uL (ref 4.0–10.5)
nRBC: 0 % (ref 0.0–0.2)

## 2020-06-10 LAB — BASIC METABOLIC PANEL
Anion gap: 10 (ref 5–15)
BUN: 19 mg/dL (ref 8–23)
CO2: 24 mmol/L (ref 22–32)
Calcium: 9 mg/dL (ref 8.9–10.3)
Chloride: 106 mmol/L (ref 98–111)
Creatinine, Ser: 1.62 mg/dL — ABNORMAL HIGH (ref 0.44–1.00)
GFR calc Af Amer: 37 mL/min — ABNORMAL LOW (ref >60)
GFR calc non Af Amer: 32 mL/min — ABNORMAL LOW (ref >60)
Glucose, Bld: 113 mg/dL — ABNORMAL HIGH (ref 70–99)
Potassium: 3.9 mmol/L (ref 3.5–5.1)
Sodium: 140 mmol/L (ref 135–145)

## 2020-06-10 MED ORDER — AMOXICILLIN-POT CLAVULANATE 875-125 MG PO TABS
1.0000 | ORAL_TABLET | Freq: Two times a day (BID) | ORAL | Status: DC
  Administered 2020-06-10 – 2020-06-14 (×8): 1 via ORAL
  Filled 2020-06-10 (×9): qty 1

## 2020-06-10 NOTE — Evaluation (Signed)
Occupational Therapy Evaluation Patient Details Name: Carrie Benson MRN: [id number] DOB: Jun 17, 1951 Today's Date: 06/10/2020    History of Present Illness Pt admitted from SNF with AMS and hypoxia 2* aspiration PNA.  Pt with hx of DM, CKD, CVA and Schizoaffective disorder   Clinical Impression   Pt admitted with AMS. Pt currently with functional limitations due to the deficits listed below (see OT Problem List).  Pt will benefit from skilled OT to increase their safety and independence with ADL and functional mobility for ADL to facilitate discharge to venue listed below.      Follow Up Recommendations  SNF    Equipment Recommendations  None recommended by OT    Recommendations for Other Services       Precautions / Restrictions Precautions Precautions: Fall Restrictions Weight Bearing Restrictions: No      Mobility Bed Mobility Overal bed mobility: Needs Assistance Bed Mobility: Supine to Sit     Supine to sit: Mod assist Sit to supine: Mod assist   General bed mobility comments: sitting EOB for grooming activity  Transfers Overall transfer level: Needs assistance   Transfers: Sit to/from Stand Sit to Stand: Min assist;+2 physical assistance         General transfer comment: assist to bring wt up and fwd and to balance in standing    Balance Overall balance assessment: Needs assistance Sitting-balance support: No upper extremity supported;Feet supported Sitting balance-Leahy Scale: Fair     Standing balance support: Bilateral upper extremity supported Standing balance-Leahy Scale: Poor                             ADL either performed or assessed with clinical judgement   ADL Overall ADL's : Needs assistance/impaired Eating/Feeding: Moderate assistance;Sitting   Grooming: Moderate assistance;Sitting   Upper Body Bathing: Moderate assistance;Sitting   Lower Body Bathing: Maximal assistance;Sitting/lateral leans   Upper Body Dressing  : Moderate assistance;Sitting   Lower Body Dressing: Moderate assistance;Sit to/from stand;Cueing for safety;Cueing for sequencing;Sitting/lateral leans                 General ADL Comments: declined getting to chair- wanted to go back to bed                  Pertinent Vitals/Pain Pain Assessment: No/denies pain     Hand Dominance     Extremity/Trunk Assessment Upper Extremity Assessment Upper Extremity Assessment: Generalized weakness   Lower Extremity Assessment Lower Extremity Assessment: Overall WFL for tasks assessed;Generalized weakness       Communication Communication Communication: Expressive difficulties   Cognition Arousal/Alertness: Lethargic (but rousable) Behavior During Therapy: Flat affect;Impulsive Overall Cognitive Status: No family/caregiver present to determine baseline cognitive functioning                                                Home Living Family/patient expects to be discharged to:: Skilled nursing facility                                        Prior Functioning/Environment          Comments: No information, pt is not reliable historian but denies use of any AD prior to admit  OT Problem List: Decreased strength;Impaired balance (sitting and/or standing);Decreased activity tolerance      OT Treatment/Interventions: Self-care/ADL training;Therapeutic activities;Patient/family education    OT Goals(Current goals can be found in the care plan section) Acute Rehab OT Goals Patient Stated Goal: Back to bed ( stated while sitting EOB) OT Goal Formulation: With patient  OT Frequency: Min 2X/week    AM-PAC OT "6 Clicks" Daily Activity     Outcome Measure Help from another person eating meals?: A Little Help from another person taking care of personal grooming?: A Little Help from another person toileting, which includes using toliet, bedpan, or urinal?: A Lot Help from another  person bathing (including washing, rinsing, drying)?: A Lot Help from another person to put on and taking off regular upper body clothing?: A Little Help from another person to put on and taking off regular lower body clothing?: A Lot 6 Click Score: 15   End of Session Nurse Communication: Mobility status  Activity Tolerance: Patient limited by fatigue Patient left: in bed;with call bell/phone within reach;with bed alarm set  OT Visit Diagnosis: Unsteadiness on feet (R26.81);Muscle weakness (generalized) (M62.81);Other abnormalities of gait and mobility (R26.89)                Time: 0175-1025 OT Time Calculation (min): 17 min Charges:  OT General Charges $OT Visit: 1 Visit OT Evaluation $OT Eval Moderate Complexity: 1 Mod  Lise Auer, OT Acute Rehabilitation Services [phone] Office- [phone]     Lorita Forinash, Karin Golden D 06/10/2020, 2:56 PM

## 2020-06-10 NOTE — Progress Notes (Signed)
[facility] PROGRESS NOTE   Diamonds Lippard DJM:[id number] DOB: October 20, 1951 DOA: 06/07/2020  PCP: Patient, No Pcp Per  Brief History/Interval Summary: 69 yo female with PMH of essential hypertension, CKDIII, depression, schizoaffective disorder, GAD, type 2 diabetes who presented from her SNF appearing altered and hypoxic.  She was reportedly only responsive to painful stimuli upon arrival.  She was requiring 10 L nonrebreather via EMS on arrival which improved to 4 L nasal cannula oxygen in the ER.    Chest x-ray raised concern for pneumonia.  There was concern for aspiration.  Patient was hospitalized for further management.  Reason for Visit: Aspiration pneumonia.  Acute respiratory failure with hypoxia  Consultants: None  Procedures: None yet  Antibiotics: Anti-infectives (From admission, onward)   Start     Dose/Rate Route Frequency Ordered Stop   06/09/20 1600  Ampicillin-Sulbactam (UNASYN) 3 g in sodium chloride 0.9 % 100 mL IVPB     Discontinue     3 g 200 mL/hr over 30 Minutes Intravenous Every 6 hours 06/09/20 1433     06/07/20 2100  ampicillin-sulbactam (UNASYN) 1.5 g in sodium chloride 0.9 % 100 mL IVPB  Status:  Discontinued        1.5 g 200 mL/hr over 30 Minutes Intravenous Every 12 hours 06/07/20 2048 06/09/20 1435   06/07/20 1845  vancomycin (VANCOREADY) IVPB 2000 mg/400 mL  Status:  Discontinued        2,000 mg 200 mL/hr over 120 Minutes Intravenous STAT 06/07/20 1834 06/07/20 2048   06/07/20 1830  vancomycin (VANCOCIN) 2,250 mg in sodium chloride 0.9 % 500 mL IVPB  Status:  Discontinued        2,250 mg 250 mL/hr over 120 Minutes Intravenous STAT 06/07/20 1817 06/07/20 1834   06/07/20 1800  ceFEPIme (MAXIPIME) 2 g in sodium chloride 0.9 % 100 mL IVPB  Status:  Discontinued        2 g 200 mL/hr over 30 Minutes Intravenous  Once 06/07/20 1750 06/07/20 2048      Subjective/Interval History: Patient continues to have episodes of agitation mainly in the  nighttime.  Seems to be calm this morning.  Distracted as she has been previously.  No complaints offered.      Assessment/Plan:  Acute respiratory failure with hypoxia This is secondary to aspiration pneumonia.  Appear to have improved.  She is now on 2 L of oxygen.  Continue to wean down.  Seems to be stable from a respiratory standpoint.    Aspiration pneumonia Patient noted to have poor dentition.  There is also concern for oropharyngeal dysphagia.  Speech therapy is following. She is on a dysphagia diet.  Patient was started on Unasyn.  Considering stability and improvement we will change her to Augmentin today.  Procalcitonin was less than 0.1.  Chest x-ray repeated yesterday morning showed stable findings.    Acute renal failure on chronic kidney disease stage IIIb Baseline creatinine around 1.8.  Had creatinine of 3.0 at presentation.  Patient was hydrated.  Renal function has improved with a creatinine of 1.62 this morning.  IV fluids were discontinued.  Monitor urine output.    Diabetes mellitus type 2, with renal complications, with chronic kidney disease stage IIIb HbA1c 6.8.  At SNF she is noted to be on Humalog.  CBGs are stable.  Acute metabolic encephalopathy Baseline mental status is not clearly known.  She does have a history of schizoaffective disorder and depression.  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  Does  not have any focal neurological deficits.  Infection likely contributing to encephalopathy.  New environment also contributing. Episodes of agitation most likely sundowning.  She likely has cognitive impairment as well.  She was changed over to Seroquel from Risperdal.  QTC was normal yesterday.  Continue to monitor.    Essential hypertension Noted to be on amlodipine and metoprolol prior to admission. Blood pressure medicines were placed on hold at admission.  Noted to be hypertensive.  Antihypertensives were resumed yesterday.  Agitation is contributing to elevated blood  pressure.  May need to add additional agents.  History of schizoaffective disorder/depression Prior to admission she was noted to be on benztropine, fluoxetine, Risperdal and trazodone.  Her medications were resumed.  Risperdal was switched out Seroquel.    Obesity Estimated body mass index is 37.2 kg/m as calculated from the following:   Height as of this encounter: 5\' 3"  (1.6 m).   Weight as of this encounter: 95.3 kg.   DVT Prophylaxis: Subcutaneous heparin Code Status: Full code Family Communication: No family at bedside Disposition Plan: From skilled nursing facility.  Hopefully back to the same level of care when improved.  Status is: Inpatient  Remains inpatient appropriate because:Altered mental status and IV treatments appropriate due to intensity of illness or inability to take PO   Dispo:  Patient From: Skilled Nursing Facility  Planned Disposition: Skilled Nursing Facility  Expected discharge date: 06/12/20  Medically stable for discharge: No      Medications:  Scheduled: . acidophilus  1 capsule Oral Daily  . amLODipine  10 mg Oral Daily  . aspirin EC  81 mg Oral Daily  . atorvastatin  80 mg Oral Daily  . benztropine  0.5 mg Oral BID  . chlorhexidine  15 mL Mouth Rinse BID  . Chlorhexidine Gluconate Cloth  6 each Topical Daily  . FLUoxetine  20 mg Oral Daily  . heparin  5,000 Units Subcutaneous Q8H  . insulin aspart  0-15 Units Subcutaneous Q4H  . mouth rinse  15 mL Mouth Rinse q12n4p  . metoprolol tartrate  25 mg Oral BID  . nicotine  14 mg Transdermal Daily  . pantoprazole  40 mg Oral Daily  . QUEtiapine  50 mg Oral BID  . traZODone  25 mg Oral QHS   Continuous: . ampicillin-sulbactam (UNASYN) IV 3 g (06/10/20 1134)   PRN:   Objective:  Vital Signs  Vitals:   06/09/20 1300 06/09/20 2126 06/10/20 0210 06/10/20 0510  BP: (!) 162/76 (!) 159/74 (!) 164/82 (!) 175/79  Pulse: 76 70 76 55  Resp: 14 17 18 18   Temp: (!) 96.3 F (35.7 C) 97.9 F  (36.6 C) 98.8 F (37.1 C) 98.7 F (37.1 C)  TempSrc: Axillary Oral Axillary Axillary  SpO2: 97% 96% 95% 100%  Weight:      Height:        Intake/Output Summary (Last 24 hours) at 06/10/2020 1217 Last data filed at 06/10/2020 0720 Gross per 24 hour  Intake 744.98 ml  Output 0 ml  Net 744.98 ml   Filed Weights   06/07/20 1812 06/07/20 1830  Weight: 108.9 kg 95.3 kg    General appearance: Awake alert.  Remains distracted.  In no distress. Resp: Normal effort.  Few crackles at the bases right more than left.  No wheezing or rhonchi.   Cardio: S1-S2 is normal regular.  No S3-S4.  No rubs murmurs or bruit GI: Abdomen is soft.  Nontender nondistended.  Bowel sounds are present normal.  No masses organomegaly Extremities: No edema.  Full range of motion of lower extremities. Neurologic:  No focal neurological deficits.     Lab Results:  Data Reviewed: I have personally reviewed following labs and imaging studies  CBC: Recent Labs  Lab 06/07/20 1612 06/07/20 1716 06/09/20 0922 06/10/20 0328  WBC 6.8  --  7.0 7.8  NEUTROABS 4.3  --   --   --   HGB 12.5 12.9 12.2 12.3  HCT 40.6 38.0 37.6 38.6  MCV 100.5*  --  97.9 99.2  PLT 172  --  181 165    Basic Metabolic Panel: Recent Labs  Lab 06/07/20 1612 06/07/20 1716 06/08/20 0258 06/09/20 0922 06/10/20 0328  NA 142 142 142 137 140  K 4.4 5.4* 4.5 3.7 3.9  CL 103 104 108 103 106  CO2 28  --  23 24 24   GLUCOSE 146* 140* 87 118* 113*  BUN 31* 41* 29* 22 19  CREATININE 2.70* 3.00* 2.33* 1.79* 1.62*  CALCIUM 9.2  --  8.8* 8.9 9.0  MG  --   --  2.0  --   --     GFR: Estimated Creatinine Clearance: 36.5 mL/min (A) (by C-G formula based on SCr of 1.62 mg/dL (H)).  Liver Function Tests: Recent Labs  Lab 06/07/20 1612  AST 12*  ALT 10  ALKPHOS 60  BILITOT 0.3  PROT 6.8  ALBUMIN 3.5    Coagulation Profile: Recent Labs  Lab 06/07/20 1612  INR 0.9    HbA1C: Recent Labs    06/07/20 2129  HGBA1C 6.8*     CBG: Recent Labs  Lab 06/09/20 1958 06/10/20 0001 06/10/20 0407 06/10/20 0752 06/10/20 1125  GLUCAP 106* 128* 92 70 92     Recent Results (from the past 240 hour(s))  Blood Culture (routine x 2)     Status: None (Preliminary result)   Collection Time: 06/07/20  4:12 PM   Specimen: BLOOD  Result Value Ref Range Status   Specimen Description   Final    BLOOD LEFT ARM Performed at [facility], [address], [place], [place]    Special Requests   Final    BOTTLES DRAWN AEROBIC AND ANAEROBIC Blood Culture adequate volume Performed at [facility], [address], [place], [place]    Culture   Final    NO GROWTH 3 DAYS Performed at [facility], [address] [place], [place]    Report Status PENDING  Incomplete  SARS Coronavirus 2 by RT PCR (hospital order, performed in [facility] hospital lab) Nasopharyngeal Nasopharyngeal Swab     Status: None   Collection Time: 06/07/20  5:56 PM   Specimen: Nasopharyngeal Swab  Result Value Ref Range Status   SARS Coronavirus 2 NEGATIVE NEGATIVE Final    Comment: (NOTE) SARS-CoV-2 target nucleic acids are NOT DETECTED.  The SARS-CoV-2 RNA is generally detectable in upper and lower respiratory specimens during the acute phase of infection. The lowest concentration of SARS-CoV-2 viral copies this assay can detect is 250 copies / mL. A negative result does not preclude SARS-CoV-2 infection and should not be used as the sole basis for treatment or other patient management decisions.  A negative result may occur with improper specimen collection / handling, submission of specimen other than nasopharyngeal swab, presence of viral mutation(s) within the areas targeted by this assay, and inadequate number of viral copies (<250 copies / mL). A negative result must  be combined with clinical observations, patient history, and epidemiological information.  Fact  Sheet for Patients:   BoilerBrush.com.cy  Fact Sheet for Healthcare Providers: https://pope.com/  This test is not yet approved or  cleared by the [place] FDA and has been authorized for detection and/or diagnosis of SARS-CoV-2 by FDA under an Emergency Use Authorization (EUA).  This EUA will remain in effect (meaning this test can be used) for the duration of the COVID-19 declaration under Section 564(b)(1) of the Act, 21 U.S.C. section 360bbb-3(b)(1), unless the authorization is terminated or revoked sooner.  Performed at [facility], [address], [place], [place] [address]   Urine culture     Status: Abnormal   Collection Time: 06/07/20  6:30 PM   Specimen: In/Out Cath Urine  Result Value Ref Range Status   Specimen Description   Final    IN/OUT CATH URINE Performed at [facility], [address] [place], [place] [address]    Special Requests   Final    NONE Performed at [facility], [address] [place], [place] [address]    Culture 4,000 COLONIES/mL YEAST (A)  Final   Report Status 06/09/2020 FINAL  Final  MRSA PCR Screening     Status: None   Collection Time: 06/07/20  8:48 PM   Specimen: Nasal Mucosa; Nasopharyngeal  Result Value Ref Range Status   MRSA by PCR NEGATIVE NEGATIVE Final    Comment:        The GeneXpert MRSA Assay (FDA approved for NASAL specimens only), is one component of a comprehensive MRSA colonization surveillance program. It is not intended to diagnose MRSA infection nor to guide or monitor treatment for MRSA infections. Performed at [facility], [address], [place], [place] [address]   Blood Culture (routine x 2)     Status: None (Preliminary result)   Collection Time: 06/07/20  9:29 PM   Specimen: BLOOD RIGHT ARM  Result Value Ref Range Status   Specimen Description   Final    BLOOD RIGHT  ARM Performed at [facility], [address] [place], [place] [address]    Special Requests   Final    BOTTLES DRAWN AEROBIC ONLY Blood Culture adequate volume Performed at [facility], [address], [place], [place] [address]    Culture   Final    NO GROWTH 3 DAYS Performed at [facility], [address] [place], [place] [address]    Report Status PENDING  Incomplete      Radiology Studies: DG CHEST PORT 1 VIEW  Result Date: 06/09/2020 CLINICAL DATA:  Hypoxia.  Follow-up pneumonia. EXAM: PORTABLE CHEST 1 VIEW COMPARISON:  06/07/2020. FINDINGS: Mediastinum hilar structures normal. Persistent bibasilar atelectasis/infiltrates with slight improvement in aeration from prior exam. No pleural effusion or pneumothorax. Heart size stable. No acute bony abnormality. IMPRESSION: Persistent bibasilar atelectasis/infiltrates with slight improvement in aeration from prior exam. Electronically Signed   By: Maisie Fus  Register   On: 06/09/2020 06:56       LOS: 3 days   Osvaldo Shipper  [facility] Pager on [email or web address]  06/10/2020, 12:17 PM

## 2020-06-10 NOTE — Evaluation (Signed)
Physical Therapy Evaluation Patient Details Name: Carrie Benson MRN: [id number] DOB: 04/04/1951 Today's Date: 06/10/2020   History of Present Illness  Pt admitted from SNF with AMS and hypoxia 2* aspiration PNA.  Pt with hx of DM, CKD, CVA and Schizoaffective disorder  Clinical Impression  Pt admitted as above and presenting with functional mobility limitations 2* generalized weakness, fatigue and balance deficits.  Pt would benefit from follow up rehab at SNF level to maximize IND and safety.    Follow Up Recommendations SNF    Equipment Recommendations  None recommended by PT    Recommendations for Other Services       Precautions / Restrictions Precautions Precautions: Fall Restrictions Weight Bearing Restrictions: No      Mobility  Bed Mobility Overal bed mobility: Needs Assistance Bed Mobility: Supine to Sit;Sit to Supine     Supine to sit: Min assist;Mod assist Sit to supine: Min assist   General bed mobility comments: assist to bring trunk to upright sitting and to bring LEs back onto bed  Transfers Overall transfer level: Needs assistance   Transfers: Sit to/from Stand Sit to Stand: Min assist;+2 physical assistance         General transfer comment: assist to bring wt up and fwd and to balance in standing  Ambulation/Gait Ambulation/Gait assistance: Min assist;+2 safety/equipment;+2 physical assistance Gait Distance (Feet): 90 Feet Assistive device: 2 person hand held assist Gait Pattern/deviations: Step-through pattern;Decreased step length - right;Decreased step length - left;Shuffle;Staggering right;Staggering left Gait velocity: decr   General Gait Details: Pt denies use of AD.  Hand rail and HHA vs HHA of 2 for stability/support to ambulate in hall  Stairs            Wheelchair Mobility    Modified Rankin (Stroke Patients Only)       Balance Overall balance assessment: Needs assistance Sitting-balance support: No upper extremity  supported;Feet supported Sitting balance-Leahy Scale: Fair     Standing balance support: Bilateral upper extremity supported Standing balance-Leahy Scale: Poor                               Pertinent Vitals/Pain Pain Assessment: No/denies pain    Home Living Family/patient expects to be discharged to:: Skilled nursing facility                      Prior Function           Comments: No information, pt is not reliable historian but denies use of any AD prior to admit     Hand Dominance        Extremity/Trunk Assessment   Upper Extremity Assessment Upper Extremity Assessment: Generalized weakness    Lower Extremity Assessment Lower Extremity Assessment: Overall WFL for tasks assessed;Generalized weakness       Communication   Communication: Expressive difficulties  Cognition Arousal/Alertness: Lethargic (but rousable) Behavior During Therapy: Flat affect;Impulsive Overall Cognitive Status: No family/caregiver present to determine baseline cognitive functioning                                        General Comments      Exercises     Assessment/Plan    PT Assessment Patient needs continued PT services  PT Problem List Decreased strength;Decreased activity tolerance;Decreased balance;Decreased mobility;Decreased cognition;Decreased knowledge of use of DME;Decreased safety awareness;Obesity  PT Treatment Interventions DME instruction;Gait training;Functional mobility training;Therapeutic activities;Therapeutic exercise;Balance training;Patient/family education    PT Goals (Current goals can be found in the Care Plan section)  Acute Rehab PT Goals Patient Stated Goal: Back to sleep PT Goal Formulation: With patient Time For Goal Achievement: 06/24/20 Potential to Achieve Goals: Fair    Frequency Min 2X/week   Barriers to discharge        Co-evaluation               AM-PAC PT "6 Clicks" Mobility   Outcome Measure Help needed turning from your back to your side while in a flat bed without using bedrails?: A Little   Help needed moving to and from a bed to a chair (including a wheelchair)?: A Lot Help needed standing up from a chair using your arms (e.g., wheelchair or bedside chair)?: A Lot Help needed to walk in hospital room?: A Lot Help needed climbing 3-5 steps with a railing? : A Lot 6 Click Score: 11    End of Session Equipment Utilized During Treatment: Gait belt;Oxygen Activity Tolerance: Patient limited by fatigue Patient left: in bed;with call bell/phone within reach;with bed alarm set Nurse Communication: Mobility status PT Visit Diagnosis: Unsteadiness on feet (R26.81);History of falling (Z91.81);Difficulty in walking, not elsewhere classified (R26.2);Muscle weakness (generalized) (M62.81)    Time: 1038-1100 PT Time Calculation (min) (ACUTE ONLY): 22 min   Charges:   PT Evaluation $PT Eval Low Complexity: 1 Low          Mauro Kaufmann PT Acute Rehabilitation Services Pager [phone] Office [phone]   Carrie Benson 06/10/2020, 12:03 PM

## 2020-06-11 LAB — BASIC METABOLIC PANEL
Anion gap: 9 (ref 5–15)
BUN: 19 mg/dL (ref 8–23)
CO2: 24 mmol/L (ref 22–32)
Calcium: 8.6 mg/dL — ABNORMAL LOW (ref 8.9–10.3)
Chloride: 106 mmol/L (ref 98–111)
Creatinine, Ser: 1.79 mg/dL — ABNORMAL HIGH (ref 0.44–1.00)
GFR calc Af Amer: 33 mL/min — ABNORMAL LOW (ref >60)
GFR calc non Af Amer: 29 mL/min — ABNORMAL LOW (ref >60)
Glucose, Bld: 97 mg/dL (ref 70–99)
Potassium: 4.4 mmol/L (ref 3.5–5.1)
Sodium: 139 mmol/L (ref 135–145)

## 2020-06-11 LAB — CBC
HCT: 37.7 % (ref 36.0–46.0)
Hemoglobin: 11.7 g/dL — ABNORMAL LOW (ref 12.0–15.0)
MCH: 31.1 pg (ref 26.0–34.0)
MCHC: 31 g/dL (ref 30.0–36.0)
MCV: 100.3 fL — ABNORMAL HIGH (ref 80.0–100.0)
Platelets: 160 10*3/uL (ref 150–400)
RBC: 3.76 MIL/uL — ABNORMAL LOW (ref 3.87–5.11)
RDW: 13.5 % (ref 11.5–15.5)
WBC: 6.5 10*3/uL (ref 4.0–10.5)
nRBC: 0 % (ref 0.0–0.2)

## 2020-06-11 LAB — GLUCOSE, CAPILLARY
Glucose-Capillary: 81 mg/dL (ref 70–99)
Glucose-Capillary: 115 mg/dL — ABNORMAL HIGH (ref 70–99)
Glucose-Capillary: 126 mg/dL — ABNORMAL HIGH (ref 70–99)
Glucose-Capillary: 97 mg/dL (ref 70–99)
Glucose-Capillary: 97 mg/dL (ref 70–99)

## 2020-06-11 MED ORDER — SODIUM CHLORIDE 0.9 % IV SOLN: INTRAVENOUS | Status: AC

## 2020-06-11 NOTE — Progress Notes (Signed)
Patient did have incontinent episode of urine, purewick displaced- patient repositioned and new pad placed.

## 2020-06-11 NOTE — Progress Notes (Signed)
[facility] PROGRESS NOTE   Carrie BradyJanet Benson UEA:[id number] DOB: 1951-02-24 DOA: 06/07/2020  PCP: Patient, No Pcp Per  Brief History/Interval Summary: 69 yo female with PMH of essential hypertension, CKDIII, depression, schizoaffective disorder, GAD, type 2 diabetes who presented from her SNF appearing altered and hypoxic.  She was reportedly only responsive to painful stimuli upon arrival.  She was requiring 10 L nonrebreather via EMS on arrival which improved to 4 L nasal cannula oxygen in the ER.    Chest x-ray raised concern for pneumonia.  There was concern for aspiration.  Patient was hospitalized for further management.  Reason for Visit: Aspiration pneumonia.  Acute respiratory failure with hypoxia  Consultants: None  Procedures: None yet  Antibiotics: Anti-infectives (From admission, onward)   Start     Dose/Rate Route Frequency Ordered Stop   06/10/20 1230  amoxicillin-clavulanate (AUGMENTIN) 875-125 MG per tablet 1 tablet     Discontinue     1 tablet Oral Every 12 hours 06/10/20 1223     06/09/20 1600  Ampicillin-Sulbactam (UNASYN) 3 g in sodium chloride 0.9 % 100 mL IVPB  Status:  Discontinued        3 g 200 mL/hr over 30 Minutes Intravenous Every 6 hours 06/09/20 1433 06/10/20 1223   06/07/20 2100  ampicillin-sulbactam (UNASYN) 1.5 g in sodium chloride 0.9 % 100 mL IVPB  Status:  Discontinued        1.5 g 200 mL/hr over 30 Minutes Intravenous Every 12 hours 06/07/20 2048 06/09/20 1435   06/07/20 1845  vancomycin (VANCOREADY) IVPB 2000 mg/400 mL  Status:  Discontinued        2,000 mg 200 mL/hr over 120 Minutes Intravenous STAT 06/07/20 1834 06/07/20 2048   06/07/20 1830  vancomycin (VANCOCIN) 2,250 mg in sodium chloride 0.9 % 500 mL IVPB  Status:  Discontinued        2,250 mg 250 mL/hr over 120 Minutes Intravenous STAT 06/07/20 1817 06/07/20 1834   06/07/20 1800  ceFEPIme (MAXIPIME) 2 g in sodium chloride 0.9 % 100 mL IVPB  Status:  Discontinued        2 g 200  mL/hr over 30 Minutes Intravenous  Once 06/07/20 1750 06/07/20 2048      Subjective/Interval History: Apparently she slept well overnight.  Still somnolent this morning but easily arousable.  Following commands.    Assessment/Plan:  Acute respiratory failure with hypoxia This is secondary to aspiration pneumonia.  Appear to have improved.  She remains on 2 L of oxygen.  Continue to wean down.  Seems to be stable from a respiratory standpoint.    Aspiration pneumonia Patient noted to have poor dentition.  There is also concern for oropharyngeal dysphagia.  Speech therapy is following. She is on a dysphagia diet.  Patient was started on Unasyn.  Patient remained stable and showed some improvement.  Changed over to Augmentin yesterday.    Acute renal failure on chronic kidney disease stage IIIb Baseline creatinine around 1.8.  Had creatinine of 3.0 at presentation.  Patient was hydrated.  Creatinine improved.  Noted to be slightly higher this morning.  Monitor urine output.  Recheck labs tomorrow.  Encourage oral intake.  Diabetes mellitus type 2, with renal complications, with chronic kidney disease stage IIIb HbA1c 6.8.  At SNF she is noted to be on Humalog.  CBGs are stable.  Acute metabolic encephalopathy Baseline mental status is not clearly known.  She does have a history of schizoaffective disorder and depression.  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  Does not have any focal neurological deficits.  Infection likely contributing to encephalopathy.  New environment also contributing. Episodes of agitation most likely sundowning.  She likely has cognitive impairment as well.  She was changed over to Seroquel from Risperdal.  QTC was normal.   Seems to have had fewer episodes of agitation compared to the last few days.  Essential hypertension Noted to be on amlodipine and metoprolol prior to admission. Blood pressure medicines were placed on hold at admission.   Antihypertensive medications  were resumed.  Occasional high readings noted which could be due to agitation.    History of schizoaffective disorder/depression Prior to admission she was noted to be on benztropine, fluoxetine, Risperdal and trazodone.  Her medications were resumed.  Risperdal was switched to Seroquel.    Obesity Estimated body mass index is 37.2 kg/m as calculated from the following:   Height as of this encounter: 5\' 3"  (1.6 m).   Weight as of this encounter: 95.3 kg.   DVT Prophylaxis: Subcutaneous heparin Code Status: Full code Family Communication: No family at bedside Disposition Plan: From skilled nursing facility.  Hopefully back to the same level of care when improved.  Status is: Inpatient  Remains inpatient appropriate because:Altered mental status and IV treatments appropriate due to intensity of illness or inability to take PO   Dispo:  Patient From: Skilled Nursing Facility  Planned Disposition: Skilled Nursing Facility  Expected discharge date: 06/12/20  Medically stable for discharge: No      Medications:  Scheduled: . acidophilus  1 capsule Oral Daily  . amLODipine  10 mg Oral Daily  . amoxicillin-clavulanate  1 tablet Oral Q12H  . aspirin EC  81 mg Oral Daily  . atorvastatin  80 mg Oral Daily  . benztropine  0.5 mg Oral BID  . chlorhexidine  15 mL Mouth Rinse BID  . Chlorhexidine Gluconate Cloth  6 each Topical Daily  . FLUoxetine  20 mg Oral Daily  . heparin  5,000 Units Subcutaneous Q8H  . insulin aspart  0-15 Units Subcutaneous Q4H  . mouth rinse  15 mL Mouth Rinse q12n4p  . metoprolol tartrate  25 mg Oral BID  . nicotine  14 mg Transdermal Daily  . pantoprazole  40 mg Oral Daily  . QUEtiapine  50 mg Oral BID  . traZODone  25 mg Oral QHS   Continuous: . sodium chloride 100 mL/hr at 06/11/20 0600   PRN:   Objective:  Vital Signs  Vitals:   06/10/20 1339 06/10/20 2005 06/10/20 2341 06/11/20 0415  BP: (!) 150/74 (!) 128/107 (!) 142/76 (!) 105/53    Pulse: 59 69 62 58  Resp: 16 20 16 15   Temp: 97.6 F (36.4 C) 98.3 F (36.8 C) 99 F (37.2 C) 98.2 F (36.8 C)  TempSrc: Axillary Oral Axillary Axillary  SpO2: 100% 91% 98% 95%  Weight:      Height:        Intake/Output Summary (Last 24 hours) at 06/11/2020 1114 Last data filed at 06/11/2020 0600 Gross per 24 hour  Intake 638.33 ml  Output 100 ml  Net 538.33 ml   Filed Weights   06/07/20 1812 06/07/20 1830  Weight: 108.9 kg 95.3 kg    General appearance:   Somnolent but easily arousable. Resp: Normal effort at rest.  Few crackles at the bases.  No wheezing or rhonchi.   Cardio: S1-S2 is normal regular.  No S3-S4.  No rubs murmurs or bruit GI: Abdomen  is soft.  Nontender nondistended.  Bowel sounds are present normal.  No masses organomegaly Extremities: No edema.  Full range of motion of lower extremities. Neurologic: No focal neurological deficits.     Lab Results:  Data Reviewed: I have personally reviewed following labs and imaging studies  CBC: Recent Labs  Lab 06/07/20 1612 06/07/20 1716 06/09/20 0922 06/10/20 0328 06/11/20 0405  WBC 6.8  --  7.0 7.8 6.5  NEUTROABS 4.3  --   --   --   --   HGB 12.5 12.9 12.2 12.3 11.7*  HCT 40.6 38.0 37.6 38.6 37.7  MCV 100.5*  --  97.9 99.2 100.3*  PLT 172  --  181 165 160    Basic Metabolic Panel: Recent Labs  Lab 06/07/20 1612 06/07/20 1612 06/07/20 1716 06/08/20 0258 06/09/20 0922 06/10/20 0328 06/11/20 0405  NA 142   < > 142 142 137 140 139  K 4.4   < > 5.4* 4.5 3.7 3.9 4.4  CL 103   < > 104 108 103 106 106  CO2 28  --   --  23 24 24 24   GLUCOSE 146*   < > 140* 87 118* 113* 97  BUN 31*   < > 41* 29* 22 19 19   CREATININE 2.70*   < > 3.00* 2.33* 1.79* 1.62* 1.79*  CALCIUM 9.2  --   --  8.8* 8.9 9.0 8.6*  MG  --   --   --  2.0  --   --   --    < > = values in this interval not displayed.    GFR: Estimated Creatinine Clearance: 33.1 mL/min (A) (by C-G formula based on SCr of 1.79 mg/dL (H)).  Liver  Function Tests: Recent Labs  Lab 06/07/20 1612  AST 12*  ALT 10  ALKPHOS 60  BILITOT 0.3  PROT 6.8  ALBUMIN 3.5    Coagulation Profile: Recent Labs  Lab 06/07/20 1612  INR 0.9    CBG: Recent Labs  Lab 06/10/20 1545 06/10/20 2015 06/10/20 2338 06/11/20 0405 06/11/20 0714  GLUCAP 74 100* 168* 81 115*     Recent Results (from the past 240 hour(s))  Blood Culture (routine x 2)     Status: None (Preliminary result)   Collection Time: 06/07/20  4:12 PM   Specimen: BLOOD  Result Value Ref Range Status   Specimen Description   Final    BLOOD LEFT ARM Performed at [facility], [address], [place], [place]    Special Requests   Final    BOTTLES DRAWN AEROBIC AND ANAEROBIC Blood Culture adequate volume Performed at [facility], [address], [place], [place]    Culture   Final    NO GROWTH 4 DAYS Performed at [facility], [address], [place], [address] [place]    Report Status PENDING  Incomplete  SARS Coronavirus 2 by RT PCR (hospital order, performed in [facility] hospital lab) Nasopharyngeal Nasopharyngeal Swab     Status: None   Collection Time: 06/07/20  5:56 PM   Specimen: Nasopharyngeal Swab  Result Value Ref Range Status   SARS Coronavirus 2 NEGATIVE NEGATIVE Final    Comment: (NOTE) SARS-CoV-2 target nucleic acids are NOT DETECTED.  The SARS-CoV-2 RNA is generally detectable in upper and lower respiratory specimens during the acute phase of infection. The lowest concentration of SARS-CoV-2 viral copies this assay can detect is 250 copies / mL. A negative  result does not preclude SARS-CoV-2 infection and should not be used as the sole basis for treatment or other patient management decisions.  A negative result may occur with improper specimen collection / handling, submission of specimen other than nasopharyngeal swab, presence of viral mutation(s) within the areas targeted by  this assay, and inadequate number of viral copies (<250 copies / mL). A negative result must be combined with clinical observations, patient history, and epidemiological information.  Fact Sheet for Patients:   BoilerBrush.com.cy  Fact Sheet for Healthcare Providers: https://pope.com/  This test is not yet approved or  cleared by the [place] FDA and has been authorized for detection and/or diagnosis of SARS-CoV-2 by FDA under an Emergency Use Authorization (EUA).  This EUA will remain in effect (meaning this test can be used) for the duration of the COVID-19 declaration under Section 564(b)(1) of the Act, 21 U.S.C. section 360bbb-3(b)(1), unless the authorization is terminated or revoked sooner.  Performed at [facility], [address] [place], [place] [address]   Urine culture     Status: Abnormal   Collection Time: 06/07/20  6:30 PM   Specimen: In/Out Cath Urine  Result Value Ref Range Status   Specimen Description   Final    IN/OUT CATH URINE Performed at [facility], [address], [place], [place] [address]    Special Requests   Final    NONE Performed at [facility], [address] [place], [place] [address]    Culture 4,000 COLONIES/mL YEAST (A)  Final   Report Status 06/09/2020 FINAL  Final  MRSA PCR Screening     Status: None   Collection Time: 06/07/20  8:48 PM   Specimen: Nasal Mucosa; Nasopharyngeal  Result Value Ref Range Status   MRSA by PCR NEGATIVE NEGATIVE Final    Comment:        The GeneXpert MRSA Assay (FDA approved for NASAL specimens only), is one component of a comprehensive MRSA colonization surveillance program. It is not intended to diagnose MRSA infection nor to guide or monitor treatment for MRSA infections. Performed at [facility], [address], [place], [place] [address]   Blood Culture (routine x  2)     Status: None (Preliminary result)   Collection Time: 06/07/20  9:29 PM   Specimen: BLOOD RIGHT ARM  Result Value Ref Range Status   Specimen Description   Final    BLOOD RIGHT ARM Performed at [facility], [address], [place], [place] [address]    Special Requests   Final    BOTTLES DRAWN AEROBIC ONLY Blood Culture adequate volume Performed at [facility], [address], [place], [place] [address]    Culture   Final    NO GROWTH 4 DAYS Performed at [facility], [address] [place], [place] [address]    Report Status PENDING  Incomplete      Radiology Studies: No results found.     LOS: 4 days   Bolden Hagerman [employer] on [email or web address]  06/11/2020, 11:14 AM

## 2020-06-11 NOTE — Plan of Care (Signed)

## 2020-06-11 NOTE — Plan of Care (Signed)
  Problem: Coping: Goal: Level of anxiety will decrease Outcome: Progressing   Problem: Pain Managment: Goal: General experience of comfort will improve Outcome: Progressing   Problem: Safety: Goal: Ability to remain free from injury will improve Outcome: Progressing   

## 2020-06-12 ENCOUNTER — Inpatient Hospital Stay (HOSPITAL_COMMUNITY): Payer: Medicare HMO

## 2020-06-12 LAB — GLUCOSE, CAPILLARY
Glucose-Capillary: 107 mg/dL — ABNORMAL HIGH (ref 70–99)
Glucose-Capillary: 112 mg/dL — ABNORMAL HIGH (ref 70–99)
Glucose-Capillary: 114 mg/dL — ABNORMAL HIGH (ref 70–99)
Glucose-Capillary: 128 mg/dL — ABNORMAL HIGH (ref 70–99)
Glucose-Capillary: 156 mg/dL — ABNORMAL HIGH (ref 70–99)
Glucose-Capillary: 99 mg/dL (ref 70–99)

## 2020-06-12 LAB — CBC
HCT: 33.5 % — ABNORMAL LOW (ref 36.0–46.0)
Hemoglobin: 11 g/dL — ABNORMAL LOW (ref 12.0–15.0)
MCH: 31.4 pg (ref 26.0–34.0)
MCHC: 32.8 g/dL (ref 30.0–36.0)
MCV: 95.7 fL (ref 80.0–100.0)
Platelets: 163 10*3/uL (ref 150–400)
RBC: 3.5 MIL/uL — ABNORMAL LOW (ref 3.87–5.11)
RDW: 13.8 % (ref 11.5–15.5)
WBC: 7.2 10*3/uL (ref 4.0–10.5)
nRBC: 0 % (ref 0.0–0.2)

## 2020-06-12 LAB — BASIC METABOLIC PANEL
Anion gap: 11 (ref 5–15)
BUN: 22 mg/dL (ref 8–23)
CO2: 20 mmol/L — ABNORMAL LOW (ref 22–32)
Calcium: 8.1 mg/dL — ABNORMAL LOW (ref 8.9–10.3)
Chloride: 104 mmol/L (ref 98–111)
Creatinine, Ser: 1.93 mg/dL — ABNORMAL HIGH (ref 0.44–1.00)
GFR calc Af Amer: 30 mL/min — ABNORMAL LOW (ref >60)
GFR calc non Af Amer: 26 mL/min — ABNORMAL LOW (ref >60)
Glucose, Bld: 116 mg/dL — ABNORMAL HIGH (ref 70–99)
Potassium: 4.8 mmol/L (ref 3.5–5.1)
Sodium: 135 mmol/L (ref 135–145)

## 2020-06-12 LAB — CULTURE, BLOOD (ROUTINE X 2)
Culture: NO GROWTH
Culture: NO GROWTH
Special Requests: ADEQUATE
Special Requests: ADEQUATE

## 2020-06-12 MED ORDER — SODIUM CHLORIDE 0.9 % IV SOLN: INTRAVENOUS | Status: DC

## 2020-06-12 NOTE — Progress Notes (Signed)
[facility] PROGRESS NOTE   Carrie Benson ZOX:[id number] DOB: 11/17/1951 DOA: 06/07/2020  PCP: Patient, No Pcp Per  Brief History/Interval Summary: 69 yo female with PMH of essential hypertension, CKDIII, depression, schizoaffective disorder, GAD, type 2 diabetes who presented from her SNF appearing altered and hypoxic.  She was reportedly only responsive to painful stimuli upon arrival.  She was requiring 10 L nonrebreather via EMS on arrival which improved to 4 L nasal cannula oxygen in the ER.    Chest x-ray raised concern for pneumonia.  There was concern for aspiration.  Patient was hospitalized for further management.  Reason for Visit: Aspiration pneumonia.  Acute respiratory failure with hypoxia  Consultants: None  Procedures: None yet  Antibiotics: Anti-infectives (From admission, onward)   Start     Dose/Rate Route Frequency Ordered Stop   06/10/20 1230  amoxicillin-clavulanate (AUGMENTIN) 875-125 MG per tablet 1 tablet     Discontinue     1 tablet Oral Every 12 hours 06/10/20 1223     06/09/20 1600  Ampicillin-Sulbactam (UNASYN) 3 g in sodium chloride 0.9 % 100 mL IVPB  Status:  Discontinued        3 g 200 mL/hr over 30 Minutes Intravenous Every 6 hours 06/09/20 1433 06/10/20 1223   06/07/20 2100  ampicillin-sulbactam (UNASYN) 1.5 g in sodium chloride 0.9 % 100 mL IVPB  Status:  Discontinued        1.5 g 200 mL/hr over 30 Minutes Intravenous Every 12 hours 06/07/20 2048 06/09/20 1435   06/07/20 1845  vancomycin (VANCOREADY) IVPB 2000 mg/400 mL  Status:  Discontinued        2,000 mg 200 mL/hr over 120 Minutes Intravenous STAT 06/07/20 1834 06/07/20 2048   06/07/20 1830  vancomycin (VANCOCIN) 2,250 mg in sodium chloride 0.9 % 500 mL IVPB  Status:  Discontinued        2,250 mg 250 mL/hr over 120 Minutes Intravenous STAT 06/07/20 1817 06/07/20 1834   06/07/20 1800  ceFEPIme (MAXIPIME) 2 g in sodium chloride 0.9 % 100 mL IVPB  Status:  Discontinued        2 g 200  mL/hr over 30 Minutes Intravenous  Once 06/07/20 1750 06/07/20 2048      Subjective/Interval History: No overnight events per nursing staff except for poor urine output.  She required in and out catheterization for retention.  Patient denies any complaints.  She is pleasantly confused as has been her baseline in the hospital.  Assessment/Plan:  Acute respiratory failure with hypoxia This is secondary to aspiration pneumonia.  Appear to have improved.  She remains on 2 L of oxygen.  Continue to wean down as tolerated.  Checks room air saturations.  Seems to be stable from a respiratory standpoint.  Aspiration pneumonia Patient noted to have poor dentition.  There is also concern for oropharyngeal dysphagia.  Seen by speech therapy.  She is on a dysphagia diet.  She was initially started on Unasyn.  Was changed over to Augmentin.  Acute renal failure on chronic kidney disease stage IIIb Baseline creatinine around 1.8.  Had creatinine of 3.0 at presentation.  Patient was hydrated.  Creatinine improved.   Over the last 48 hours her renal function has been worsening.  Likely due to poor oral intake.  She was given IV fluids for a few hours yesterday.  We will resume IV fluids for now.  Check renal ultrasound.    Diabetes mellitus type 2, with renal complications, with chronic kidney disease stage IIIb  HbA1c 6.8.  At SNF she is noted to be on Humalog.  CBGs are stable.  Acute metabolic encephalopathy Baseline mental status is not clearly known.  She does have a history of schizoaffective disorder and depression.  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  Does not have any focal neurological deficits.  Infection likely contributing to encephalopathy.  New environment also contributing. Episodes of agitation most likely sundowning.  She likely has cognitive impairment as well.  She was changed over to Seroquel from Risperdal.  QTC was normal.   Agitation has improved for the most part.  She does tend to  sundown in the afternoon.  Essential hypertension Noted to be on amlodipine and metoprolol prior to admission. Blood pressure medicines were placed on hold at admission.   Antihypertensive medications were resumed.  Occasional high readings noted which could be due to agitation.    History of schizoaffective disorder/depression Prior to admission she was noted to be on benztropine, fluoxetine, Risperdal and trazodone.  Her medications were resumed.  Risperdal was switched to Seroquel.    Normocytic anemia Hemoglobin is stable.  No evidence of overt blood loss.  Likely due to chronic disease.  Obesity Estimated body mass index is 37.2 kg/m as calculated from the following:   Height as of this encounter: 5\' 3"  (1.6 m).   Weight as of this encounter: 95.3 kg.   DVT Prophylaxis: Subcutaneous heparin Code Status: Full code Family Communication: No family at bedside Disposition Plan: From skilled nursing facility.  Hopefully back to the same level of care when improved.  Status is: Inpatient  Remains inpatient appropriate because:Altered mental status and IV treatments appropriate due to intensity of illness or inability to take PO   Dispo:  Patient From: Skilled Nursing Facility  Planned Disposition: Skilled Nursing Facility  Expected discharge date: 06/12/20  Medically stable for discharge: No      Medications:  Scheduled: . acidophilus  1 capsule Oral Daily  . amLODipine  10 mg Oral Daily  . amoxicillin-clavulanate  1 tablet Oral Q12H  . aspirin EC  81 mg Oral Daily  . atorvastatin  80 mg Oral Daily  . benztropine  0.5 mg Oral BID  . chlorhexidine  15 mL Mouth Rinse BID  . Chlorhexidine Gluconate Cloth  6 each Topical Daily  . FLUoxetine  20 mg Oral Daily  . heparin  5,000 Units Subcutaneous Q8H  . insulin aspart  0-15 Units Subcutaneous Q4H  . mouth rinse  15 mL Mouth Rinse q12n4p  . metoprolol tartrate  25 mg Oral BID  . nicotine  14 mg Transdermal Daily  .  pantoprazole  40 mg Oral Daily  . QUEtiapine  50 mg Oral BID  . traZODone  25 mg Oral QHS   Continuous:  PRN:   Objective:  Vital Signs  Vitals:   06/11/20 0415 06/11/20 1317 06/11/20 2030 06/12/20 0540  BP: (!) 105/53 (!) 100/58 (!) 140/55 120/72  Pulse: 58 63 63 62  Resp: 15  18 18   Temp: 98.2 F (36.8 C) 98.8 F (37.1 C) (!) 97.1 F (36.2 C) 98.3 F (36.8 C)  TempSrc: Axillary Oral Axillary Axillary  SpO2: 95% 96% 95% 95%  Weight:      Height:        Intake/Output Summary (Last 24 hours) at 06/12/2020 1013 Last data filed at 06/12/2020 0551 Gross per 24 hour  Intake 440 ml  Output 350 ml  Net 90 ml   Filed Weights   06/07/20 1812  06/07/20 1830  Weight: 108.9 kg 95.3 kg    General appearance: Awake alert.  In no distress.  Distracted Resp: Normal effort at rest.  Coarse breath sounds bilaterally.  Improved aeration compared to before.  No wheezing or rhonchi. Cardio: S1-S2 is normal regular.  No S3-S4.  No rubs murmurs or bruit GI: Abdomen is soft.  Nontender nondistended.  Bowel sounds are present normal.  No masses organomegaly Extremities: No edema.  Full range of motion of lower extremities. Neurologic:   No focal neurological deficits.    Lab Results:  Data Reviewed: I have personally reviewed following labs and imaging studies  CBC: Recent Labs  Lab 06/07/20 1612 06/07/20 1612 06/07/20 1716 06/09/20 0922 06/10/20 0328 06/11/20 0405 06/12/20 0428  WBC 6.8  --   --  7.0 7.8 6.5 7.2  NEUTROABS 4.3  --   --   --   --   --   --   HGB 12.5   < > 12.9 12.2 12.3 11.7* 11.0*  HCT 40.6   < > 38.0 37.6 38.6 37.7 33.5*  MCV 100.5*  --   --  97.9 99.2 100.3* 95.7  PLT 172  --   --  181 165 160 163   < > = values in this interval not displayed.    Basic Metabolic Panel: Recent Labs  Lab 06/08/20 0258 06/09/20 0922 06/10/20 0328 06/11/20 0405 06/12/20 0428  NA 142 137 140 139 135  K 4.5 3.7 3.9 4.4 4.8  CL 108 103 106 106 104  CO2 23 24 24 24   20*  GLUCOSE 87 118* 113* 97 116*  BUN 29* 22 19 19 22   CREATININE 2.33* 1.79* 1.62* 1.79* 1.93*  CALCIUM 8.8* 8.9 9.0 8.6* 8.1*  MG 2.0  --   --   --   --     GFR: Estimated Creatinine Clearance: 30.7 mL/min (A) (by C-G formula based on SCr of 1.93 mg/dL (H)).  Liver Function Tests: Recent Labs  Lab 06/07/20 1612  AST 12*  ALT 10  ALKPHOS 60  BILITOT 0.3  PROT 6.8  ALBUMIN 3.5    Coagulation Profile: Recent Labs  Lab 06/07/20 1612  INR 0.9    CBG: Recent Labs  Lab 06/11/20 1555 06/11/20 2009 06/12/20 0004 06/12/20 0420 06/12/20 0737  GLUCAP 97 97 156* 107* 114*     Recent Results (from the past 240 hour(s))  Blood Culture (routine x 2)     Status: None (Preliminary result)   Collection Time: 06/07/20  4:12 PM   Specimen: BLOOD  Result Value Ref Range Status   Specimen Description   Final    BLOOD LEFT ARM Performed at [facility], [address] [place], [place]    Special Requests   Final    BOTTLES DRAWN AEROBIC AND ANAEROBIC Blood Culture adequate volume Performed at [facility], [address], [place], [place]    Culture   Final    NO GROWTH 4 DAYS Performed at [facility], [address], [place], [address] [place]    Report Status PENDING  Incomplete  SARS Coronavirus 2 by RT PCR (hospital order, performed in [facility] hospital lab) Nasopharyngeal Nasopharyngeal Swab     Status: None   Collection Time: 06/07/20  5:56 PM   Specimen: Nasopharyngeal Swab  Result Value Ref Range Status   SARS Coronavirus 2 NEGATIVE NEGATIVE Final    Comment: (NOTE) SARS-CoV-2 target nucleic acids are  NOT DETECTED.  The SARS-CoV-2 RNA is generally detectable in upper and lower respiratory specimens during the acute phase of infection. The lowest concentration of SARS-CoV-2 viral copies this assay can detect is 250 copies / mL. A negative result does not preclude SARS-CoV-2 infection and  should not be used as the sole basis for treatment or other patient management decisions.  A negative result may occur with improper specimen collection / handling, submission of specimen other than nasopharyngeal swab, presence of viral mutation(s) within the areas targeted by this assay, and inadequate number of viral copies (<250 copies / mL). A negative result must be combined with clinical observations, patient history, and epidemiological information.  Fact Sheet for Patients:   BoilerBrush.com.cy  Fact Sheet for Healthcare Providers: https://pope.com/  This test is not yet approved or  cleared by the [place] FDA and has been authorized for detection and/or diagnosis of SARS-CoV-2 by FDA under an Emergency Use Authorization (EUA).  This EUA will remain in effect (meaning this test can be used) for the duration of the COVID-19 declaration under Section 564(b)(1) of the Act, 21 U.S.C. section 360bbb-3(b)(1), unless the authorization is terminated or revoked sooner.  Performed at [facility], [address], [place], [place] [address]   Urine culture     Status: Abnormal   Collection Time: 06/07/20  6:30 PM   Specimen: In/Out Cath Urine  Result Value Ref Range Status   Specimen Description   Final    IN/OUT CATH URINE Performed at [facility], [address] [place], [place] [address]    Special Requests   Final    NONE Performed at [facility], [address], [place], [place] [address]    Culture 4,000 COLONIES/mL YEAST (A)  Final   Report Status 06/09/2020 FINAL  Final  MRSA PCR Screening     Status: None   Collection Time: 06/07/20  8:48 PM   Specimen: Nasal Mucosa; Nasopharyngeal  Result Value Ref Range Status   MRSA by PCR NEGATIVE NEGATIVE Final    Comment:        The GeneXpert MRSA Assay (FDA approved for NASAL specimens only), is one component of  a comprehensive MRSA colonization surveillance program. It is not intended to diagnose MRSA infection nor to guide or monitor treatment for MRSA infections. Performed at [facility], [address], [place], [place] [address]   Blood Culture (routine x 2)     Status: None (Preliminary result)   Collection Time: 06/07/20  9:29 PM   Specimen: BLOOD RIGHT ARM  Result Value Ref Range Status   Specimen Description   Final    BLOOD RIGHT ARM Performed at [facility], [address], [place], [place] [address]    Special Requests   Final    BOTTLES DRAWN AEROBIC ONLY Blood Culture adequate volume Performed at [facility], [address], [place], [place] [address]    Culture   Final    NO GROWTH 4 DAYS Performed at [facility], [address], [place], [place] [address]    Report Status PENDING  Incomplete      Radiology Studies: No results found.     LOS: 5 days   Lilas Diefendorf [employer] on [email or web address]  06/12/2020, 10:13 AM

## 2020-06-12 NOTE — TOC Progression Note (Signed)
Transition of Care [facility]) - Progression Note    Patient Details  Name: Carrie Benson MRN: [id number] Date of Birth: 1951/03/27  Transition of Care [facility]) CM/SW Contact  Amada Jupiter, LCSW Phone Number: 06/12/2020, 12:52 PM  Clinical Narrative:   Attempted to speak with pt, however, very lethargic and could not engage.  Contacted pt's daughter, Carrie Benson [phone]) to discuss pt's return to SNF.  Daughter reports that pt was living with her prior to previous hospitalization at [facility] and the d/c to SNF was hoped to be short term only.  Daughter says that pt was at [facility] only 3 days before being admitted to this hospital.  Daughter does NOT want pt to return to [place] and requests a facility in [place]/ [place] if at all possible (daughter lives in [place]).  Have resent pt's FL2 out to SNFs in those areas and await medical clearance for d/c as well.  Will continue to follow and keep staff and family updated on offers.    Expected Discharge Plan: Skilled Nursing Facility Barriers to Discharge: Continued Medical Work up  Expected Discharge Plan and Services Expected Discharge Plan: Skilled Nursing Facility   Discharge Planning Services: CM Consult   Living arrangements for the past 2 months: Skilled Nursing Facility                                       Social Determinants of Health (SDOH) Interventions    Readmission Risk Interventions No flowsheet data found.

## 2020-06-12 NOTE — Progress Notes (Signed)
No urine output since 1900, bladder scan showed >237ml, provider on call made aware and ordered to do In and out cath with of urine out - MD informed.

## 2020-06-13 LAB — CBC
HCT: 30.5 % — ABNORMAL LOW (ref 36.0–46.0)
Hemoglobin: 10 g/dL — ABNORMAL LOW (ref 12.0–15.0)
MCH: 31.5 pg (ref 26.0–34.0)
MCHC: 32.8 g/dL (ref 30.0–36.0)
MCV: 96.2 fL (ref 80.0–100.0)
Platelets: 137 10*3/uL — ABNORMAL LOW (ref 150–400)
RBC: 3.17 MIL/uL — ABNORMAL LOW (ref 3.87–5.11)
RDW: 13.8 % (ref 11.5–15.5)
WBC: 6.3 10*3/uL (ref 4.0–10.5)
nRBC: 0 % (ref 0.0–0.2)

## 2020-06-13 LAB — BASIC METABOLIC PANEL
Anion gap: 9 (ref 5–15)
BUN: 19 mg/dL (ref 8–23)
CO2: 20 mmol/L — ABNORMAL LOW (ref 22–32)
Calcium: 8.2 mg/dL — ABNORMAL LOW (ref 8.9–10.3)
Chloride: 110 mmol/L (ref 98–111)
Creatinine, Ser: 1.88 mg/dL — ABNORMAL HIGH (ref 0.44–1.00)
GFR calc Af Amer: 31 mL/min — ABNORMAL LOW (ref >60)
GFR calc non Af Amer: 27 mL/min — ABNORMAL LOW (ref >60)
Glucose, Bld: 110 mg/dL — ABNORMAL HIGH (ref 70–99)
Potassium: 4.5 mmol/L (ref 3.5–5.1)
Sodium: 139 mmol/L (ref 135–145)

## 2020-06-13 LAB — GLUCOSE, CAPILLARY
Glucose-Capillary: 109 mg/dL — ABNORMAL HIGH (ref 70–99)
Glucose-Capillary: 110 mg/dL — ABNORMAL HIGH (ref 70–99)
Glucose-Capillary: 115 mg/dL — ABNORMAL HIGH (ref 70–99)
Glucose-Capillary: 116 mg/dL — ABNORMAL HIGH (ref 70–99)
Glucose-Capillary: 147 mg/dL — ABNORMAL HIGH (ref 70–99)
Glucose-Capillary: 97 mg/dL (ref 70–99)

## 2020-06-13 LAB — SARS CORONAVIRUS 2 (TAT 6-24 HRS): SARS Coronavirus 2: NEGATIVE

## 2020-06-13 NOTE — Care Management Important Message (Signed)
Important Message  Patient Details IM Letter presented to the Patient Name: Carrie Benson MRN: [id number] Date of Birth: 01-24-1951   Medicare Important Message Given:  Yes     [name] 06/13/2020, 11:46 AM

## 2020-06-13 NOTE — Progress Notes (Signed)
[facility] PROGRESS NOTE   Carrie BradyJanet Benson XLK:[id number] DOB: 12-09-1950 DOA: 06/07/2020  PCP: Patient, No Pcp Per  Brief History/Interval Summary: 69 yo female with PMH of essential hypertension, CKDIII, depression, schizoaffective disorder, GAD, type 2 diabetes who presented from her SNF appearing altered and hypoxic.  She was reportedly only responsive to painful stimuli upon arrival.  She was requiring 10 L nonrebreather via EMS on arrival which improved to 4 L nasal cannula oxygen in the ER.    Chest x-ray raised concern for pneumonia.  There was concern for aspiration.  Patient was hospitalized for further management.  Reason for Visit: Aspiration pneumonia.  Acute respiratory failure with hypoxia  Consultants: None  Procedures: None yet  Antibiotics: Anti-infectives (From admission, onward)   Start     Dose/Rate Route Frequency Ordered Stop   06/10/20 1230  amoxicillin-clavulanate (AUGMENTIN) 875-125 MG per tablet 1 tablet     Discontinue     1 tablet Oral Every 12 hours 06/10/20 1223     06/09/20 1600  Ampicillin-Sulbactam (UNASYN) 3 g in sodium chloride 0.9 % 100 mL IVPB  Status:  Discontinued        3 g 200 mL/hr over 30 Minutes Intravenous Every 6 hours 06/09/20 1433 06/10/20 1223   06/07/20 2100  ampicillin-sulbactam (UNASYN) 1.5 g in sodium chloride 0.9 % 100 mL IVPB  Status:  Discontinued        1.5 g 200 mL/hr over 30 Minutes Intravenous Every 12 hours 06/07/20 2048 06/09/20 1435   06/07/20 1845  vancomycin (VANCOREADY) IVPB 2000 mg/400 mL  Status:  Discontinued        2,000 mg 200 mL/hr over 120 Minutes Intravenous STAT 06/07/20 1834 06/07/20 2048   06/07/20 1830  vancomycin (VANCOCIN) 2,250 mg in sodium chloride 0.9 % 500 mL IVPB  Status:  Discontinued        2,250 mg 250 mL/hr over 120 Minutes Intravenous STAT 06/07/20 1817 06/07/20 1834   06/07/20 1800  ceFEPIme (MAXIPIME) 2 g in sodium chloride 0.9 % 100 mL IVPB  Status:  Discontinued        2 g 200  mL/hr over 30 Minutes Intravenous  Once 06/07/20 1750 06/07/20 2048      Subjective/Interval History: No overnight issues per nursing staff.  Did not require any sitter all day yesterday or in the night.  Patient remains pleasantly confused.     Assessment/Plan:  Acute respiratory failure with hypoxia This is secondary to aspiration pneumonia.  Patient has improved.  Still noted to be on oxygen at times.  Stable from a respiratory standpoint.  No increased work of breathing.   Aspiration pneumonia Patient noted to have poor dentition.  There is also concern for oropharyngeal dysphagia.  Seen by speech therapy.  She is on a dysphagia diet.  She was initially started on Unasyn.  Was changed over to Augmentin.  Plan for 10-day course.  Acute renal failure on chronic kidney disease stage IIIb Baseline creatinine around 1.8.  Had creatinine of 3.0 at presentation.  Patient was hydrated.  Creatinine improved.   Patient's creatinine again climbed a few days ago.  Started back on IV fluids due to poor oral intake.  Improved this morning.  IV fluids stopped.  Renal ultrasound did not show any acute findings.  Recheck labs tomorrow.     Diabetes mellitus type 2, with renal complications, with chronic kidney disease stage IIIb HbA1c 6.8.  At SNF she is noted to be on Humalog.  CBGs  are stable.  Acute metabolic encephalopathy Baseline mental status is not clearly known.  She does have a history of schizoaffective disorder and depression. 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  Does not have any focal neurological deficits.  Infection likely contributing to encephalopathy.  New environment also contributing. Episodes of agitation most likely sundowning.  She likely has cognitive impairment as well.  She was changed over to Seroquel from Risperdal.  QTC was normal.   Patient has not been agitated for the last 36 hours.  She has not required a sitter.  She does tend to sundown in the afternoon though.     Essential hypertension Noted to be on amlodipine and metoprolol prior to admission. Antihypertensive medications were resumed.  Occasional high readings noted which could be due to agitation.   Blood pressure is reasonably well controlled.  History of schizoaffective disorder/depression Prior to admission she was noted to be on benztropine, fluoxetine, Risperdal and trazodone.  Her medications were resumed.  Risperdal was switched to Seroquel.    Normocytic anemia Hemoglobin is stable.  No evidence of overt blood loss.  Likely due to chronic disease.  Obesity Estimated body mass index is 37.2 kg/m as calculated from the following:   Height as of this encounter:  (1.6 m).   Weight as of this encounter: 95.3 kg.   DVT Prophylaxis: Subcutaneous heparin Code Status: Full code Family Communication: No family at bedside Disposition Plan: From skilled nursing facility.  Family requesting new facility.  Social worker is following.  Status is: Inpatient  Remains inpatient appropriate because:Altered mental status and IV treatments appropriate due to intensity of illness or inability to take PO   Dispo:  Patient From: Skilled Nursing Facility  Planned Disposition: Skilled Nursing Facility  Expected discharge date: 06/15/20  Medically stable for discharge: No      Medications:  Scheduled: . acidophilus  1 capsule Oral Daily  . amLODipine  10 mg Oral Daily  . amoxicillin-clavulanate  1 tablet Oral Q12H  . aspirin EC  81 mg Oral Daily  . atorvastatin  80 mg Oral Daily  . benztropine  0.5 mg Oral BID  . chlorhexidine  15 mL Mouth Rinse BID  . FLUoxetine  20 mg Oral Daily  . heparin  5,000 Units Subcutaneous Q8H  . insulin aspart  0-15 Units Subcutaneous Q4H  . mouth rinse  15 mL Mouth Rinse q12n4p  . metoprolol tartrate  25 mg Oral BID  . nicotine  14 mg Transdermal Daily  . pantoprazole  40 mg Oral Daily  . QUEtiapine  50 mg Oral BID  . traZODone  25 mg Oral QHS    Continuous:  PRN:   Objective:  Vital Signs  Vitals:   06/12/20 1407 06/12/20 2112 06/13/20 0509 06/13/20 1020  BP: 120/65 121/66 (!) 117/56   Pulse: 67 70 62 64  Resp: Temp: 97.8 F (36.6 C) 98.7 F (37.1 C) 98.7 F (37.1 C)   TempSrc: Oral     SpO2: 100% 95% 93%   Weight:      Height:        Intake/Output Summary (Last 24 hours) at 06/13/2020 1223 Last data filed at 06/13/2020 4540 Gross per 24 hour  Intake 3043 ml  Output 400 ml  Net 2643 ml   Filed Weights   06/07/20 1812 06/07/20 1830  Weight: 108.9 kg 95.3 kg    General appearance: Awake alert.  In no distress.  Distracted Resp: Normal effort  at rest.  Few crackles at the bases but improved aeration compared to before.  No wheezing or rhonchi.   Cardio: S1-S2 is normal regular.  No S3-S4.  No rubs murmurs or bruit GI: Abdomen is soft.  Nontender nondistended.  Bowel sounds are present normal.  No masses organomegaly Extremities: No edema.  Full range of motion of lower extremities. Neurologic:  No focal neurological deficits.     Lab Results:  Data Reviewed: I have personally reviewed following labs and imaging studies  CBC: Recent Labs  Lab 06/07/20 1612 06/07/20 1716 06/09/20 0922 06/10/20 0328 06/11/20 0405 06/12/20 0428 06/13/20 0305  WBC 6.8   < > 7.0 7.8 6.5 7.2 6.3  NEUTROABS 4.3  --   --   --   --   --   --   HGB 12.5   < > 12.2 12.3 11.7* 11.0* 10.0*  HCT 40.6   < > 37.6 38.6 37.7 33.5* 30.5*  MCV 100.5*   < > 97.9 99.2 100.3* 95.7 96.2  PLT 172   < > 181 165 160 163 137*   < > = values in this interval not displayed.    Basic Metabolic Panel: Recent Labs  Lab 06/08/20 0258 06/08/20 0258 06/09/20 1610 06/10/20 0328 06/11/20 0405 06/12/20 0428 06/13/20 0305  NA 142   < > 137 140 139 135 139  K 4.5   < > 3.7 3.9 4.4 4.8 4.5  CL 108   < > 103 106 106 104 110  CO2 23   < > 24 24 24  20* 20*  GLUCOSE 87   < > 118* 113* 97 116* 110*  BUN 29*   < > 22 19 19 22 19    CREATININE 2.33*   < > 1.79* 1.62* 1.79* 1.93* 1.88*  CALCIUM 8.8*   < > 8.9 9.0 8.6* 8.1* 8.2*  MG 2.0  --   --   --   --   --   --    < > = values in this interval not displayed.    GFR: Estimated Creatinine Clearance: 31.5 mL/min (A) (by C-G formula based on SCr of 1.88 mg/dL (H)).  Liver Function Tests: Recent Labs  Lab 06/07/20 1612  AST 12*  ALT 10  ALKPHOS 60  BILITOT 0.3  PROT 6.8  ALBUMIN 3.5    Coagulation Profile: Recent Labs  Lab 06/07/20 1612  INR 0.9    CBG: Recent Labs  Lab 06/12/20 2007 06/13/20 0032 06/13/20 0359 06/13/20 0720 06/13/20 1149  GLUCAP 112* 110* 109* 115* 116*     Recent Results (from the past 240 hour(s))  Blood Culture (routine x 2)     Status: None   Collection Time: 06/07/20  4:12 PM   Specimen: BLOOD  Result Value Ref Range Status   Specimen Description   Final    BLOOD LEFT ARM Performed at [facility], [address] [place], [place]    Special Requests   Final    BOTTLES DRAWN AEROBIC AND ANAEROBIC Blood Culture adequate volume Performed at [facility], [address], [place], [place]    Culture   Final    NO GROWTH 5 DAYS Performed at [facility], [address] [place], [address] [place]    Report Status 06/12/2020 FINAL  Final  SARS Coronavirus 2 by RT PCR (hospital order, performed in [facility] hospital lab) Nasopharyngeal Nasopharyngeal Swab     Status: None  Collection Time: 06/07/20  5:56 PM   Specimen: Nasopharyngeal Swab  Result Value Ref Range Status   SARS Coronavirus 2 NEGATIVE NEGATIVE Final    Comment: (NOTE) SARS-CoV-2 target nucleic acids are NOT DETECTED.  The SARS-CoV-2 RNA is generally detectable in upper and lower respiratory specimens during the acute phase of infection. The lowest concentration of SARS-CoV-2 viral copies this assay can detect is 250 copies / mL. A negative result does not preclude SARS-CoV-2  infection and should not be used as the sole basis for treatment or other patient management decisions.  A negative result may occur with improper specimen collection / handling, submission of specimen other than nasopharyngeal swab, presence of viral mutation(s) within the areas targeted by this assay, and inadequate number of viral copies (<250 copies / mL). A negative result must be combined with clinical observations, patient history, and epidemiological information.  Fact Sheet for Patients:   BoilerBrush.com.cy  Fact Sheet for Healthcare Providers: https://pope.com/  This test is not yet approved or  cleared by the [place] FDA and has been authorized for detection and/or diagnosis of SARS-CoV-2 by FDA under an Emergency Use Authorization (EUA).  This EUA will remain in effect (meaning this test can be used) for the duration of the COVID-19 declaration under Section 564(b)(1) of the Act, 21 U.S.C. section 360bbb-3(b)(1), unless the authorization is terminated or revoked sooner.  Performed at [facility], [address], [place], [place] [address]   Urine culture     Status: Abnormal   Collection Time: 06/07/20  6:30 PM   Specimen: In/Out Cath Urine  Result Value Ref Range Status   Specimen Description   Final    IN/OUT CATH URINE Performed at [facility], [address], [place], [place] [address]    Special Requests   Final    NONE Performed at [facility], [address], [place], [place] [address]    Culture 4,000 COLONIES/mL YEAST (A)  Final   Report Status 06/09/2020 FINAL  Final  MRSA PCR Screening     Status: None   Collection Time: 06/07/20  8:48 PM   Specimen: Nasal Mucosa; Nasopharyngeal  Result Value Ref Range Status   MRSA by PCR NEGATIVE NEGATIVE Final    Comment:        The GeneXpert MRSA Assay (FDA approved for NASAL specimens only), is  one component of a comprehensive MRSA colonization surveillance program. It is not intended to diagnose MRSA infection nor to guide or monitor treatment for MRSA infections. Performed at [facility], [address], [place], [place] [address]   Blood Culture (routine x 2)     Status: None   Collection Time: 06/07/20  9:29 PM   Specimen: BLOOD RIGHT ARM  Result Value Ref Range Status   Specimen Description   Final    BLOOD RIGHT ARM Performed at [facility], [address], [place], [place] [address]    Special Requests   Final    BOTTLES DRAWN AEROBIC ONLY Blood Culture adequate volume Performed at [facility], [address], [place], [place] [address]    Culture   Final    NO GROWTH 5 DAYS Performed at [facility], [address] [place], [place] [address]    Report Status 06/12/2020 FINAL  Final      Radiology Studies: US RENAL  Result Date: 06/12/2020 CLINICAL DATA:  Acute kidney injury. EXAM: RENAL / URINARY  TRACT ULTRASOUND COMPLETE COMPARISON:  CT 08/25/2019 FINDINGS: Right Kidney: Renal measurements: 8.8 x 4.1 x 5.2 cm = volume: 98 mL . Echogenicity within normal limits. No mass or hydronephrosis visualized. Some renal vascular calcification is present. Left Kidney: Renal measurements: 9.0 x 4.8 x 4.4 cm = volume: 98 mL. Echogenicity is normal. No hydronephrosis. 1.4 cm cyst at the upper pole. Some renal vascular calcification is present. Bladder: Appears normal for degree of bladder distention. Other: None. IMPRESSION: No hydronephrosis. Normal size kidneys, unchanged since CT scan of October 2020. Normal echogenicity. Some calcification of the renal arterial branch vessels is noted. Electronically Signed   By: Paulina Fusi M.D.   On: 06/12/2020 11:48       LOS: 6 days   Nandana Krolikowski Rito Ehrlich  [facility] Pager on [email or web address]  06/13/2020, 12:23 PM

## 2020-06-14 DIAGNOSIS — R4182 Altered mental status, unspecified: Secondary | ICD-10-CM

## 2020-06-14 DIAGNOSIS — N170 Acute kidney failure with tubular necrosis: Secondary | ICD-10-CM

## 2020-06-14 LAB — BASIC METABOLIC PANEL
Anion gap: 10 (ref 5–15)
BUN: 16 mg/dL (ref 8–23)
CO2: 20 mmol/L — ABNORMAL LOW (ref 22–32)
Calcium: 8.8 mg/dL — ABNORMAL LOW (ref 8.9–10.3)
Chloride: 109 mmol/L (ref 98–111)
Creatinine, Ser: 1.64 mg/dL — ABNORMAL HIGH (ref 0.44–1.00)
GFR calc Af Amer: 37 mL/min — ABNORMAL LOW (ref >60)
GFR calc non Af Amer: 32 mL/min — ABNORMAL LOW (ref >60)
Glucose, Bld: 119 mg/dL — ABNORMAL HIGH (ref 70–99)
Potassium: 4.2 mmol/L (ref 3.5–5.1)
Sodium: 139 mmol/L (ref 135–145)

## 2020-06-14 LAB — GLUCOSE, CAPILLARY
Glucose-Capillary: 106 mg/dL — ABNORMAL HIGH (ref 70–99)
Glucose-Capillary: 119 mg/dL — ABNORMAL HIGH (ref 70–99)
Glucose-Capillary: 127 mg/dL — ABNORMAL HIGH (ref 70–99)

## 2020-06-14 MED ORDER — QUETIAPINE FUMARATE 50 MG PO TABS: 50.0000 mg | ORAL_TABLET | Freq: Two times a day (BID) | ORAL | Status: AC

## 2020-06-14 MED ORDER — AMOXICILLIN-POT CLAVULANATE 875-125 MG PO TABS: 1.0000 | ORAL_TABLET | Freq: Two times a day (BID) | ORAL | Status: AC

## 2020-06-14 NOTE — Discharge Summary (Signed)
Physician Discharge Summary   Patient ID: Carrie BradyJanet Benson MRN: [id number] DOB/AGE: [birth year] [age] y.o.  Admit date: 06/07/2020 Discharge date: 06/14/2020  Primary Care Physician:  Patient, No Pcp Per   Recommendations for Outpatient Follow-up:  1. Follow up with PCP in 1-2 weeks 2. Obtain BMET in 1 week  Home Health: Patient going to skilled nursing facility Equipment/Devices:   Discharge Condition: stable  CODE STATUS: FULL Diet recommendation: Dysphagia 2 diet with thin liquids   Discharge Diagnoses:    . 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 Nsaids     Kidney Function  . Sulfa Antibiotics Swelling and Hives     DISCHARGE MEDICATIONS: Allergies as of 06/14/2020      Reactions   Nsaids    Kidney Function   Sulfa Antibiotics Swelling, Hives      Medication List    STOP taking these medications   risperiDONE 2 MG tablet Commonly known as: RISPERDAL     TAKE these medications   acidophilus Caps capsule Take 1 capsule by mouth daily.   albuterol 108 (90 Base) MCG/ACT inhaler Commonly known as: VENTOLIN HFA Inhale 2 puffs into the lungs every 6 (six) hours as needed for wheezing or shortness of breath.   amLODipine 10 MG tablet Commonly known as: NORVASC Take 10 mg by mouth daily.   amoxicillin-clavulanate 875-125 MG tablet Commonly known as: AUGMENTIN Take 1 tablet by mouth 2 (two) times daily for 2 days.   aspirin EC 81 MG tablet Take 81 mg by mouth daily. Swallow whole.   atorvastatin 80 MG tablet Commonly known as: LIPITOR Take 80 mg by mouth daily.   benztropine 0.5 MG tablet Commonly known as: COGENTIN Take 0.5 mg by  mouth 2 (two) times daily.   chlorhexidine 0.12 % solution Commonly known as: PERIDEX Use as directed 15 mLs in the mouth or throat 4 (four) times daily.   FLUoxetine 20 MG capsule Commonly known as: PROZAC Take 20 mg by mouth daily.   insulin lispro 100 UNIT/ML injection Commonly known as: HUMALOG Inject 2-8 Units into the skin in the morning, at noon, and at bedtime. 0-200= 0 units 201-250= 2 units 251-300= 4 units 301-350= 6 units 351-400=8 units >400 notify MD   ipratropium-albuterol 0.5-2.5 (3) MG/3ML Soln Commonly known as: DUONEB Take 3 mLs by nebulization in the morning and at bedtime.   metoprolol tartrate 25 MG tablet Commonly known as: LOPRESSOR Take 25 mg by mouth 2 (two) times daily.   mirabegron ER 50 MG Tb24 tablet Commonly known as: MYRBETRIQ Take 50 mg by mouth daily.   nicotine 7 mg/24hr patch Commonly known as: NICODERM CQ - dosed in mg/24 hr Place 7 mg onto the skin daily.   pantoprazole 40 MG tablet Commonly known as: PROTONIX Take 40 mg by mouth daily.   QUEtiapine 50 MG tablet Commonly known as: SEROQUEL Take 1 tablet (50 mg total) by mouth 2 (two) times daily.   traZODone 50 MG tablet Commonly known as: DESYREL Take 25 mg by mouth at bedtime.        Brief H and P: For complete details please refer to admission H and P, but in brief patient is a 69 yo female with PMH of essential  hypertension, CKDIII, depression, schizoaffective disorder, GAD, type 2 diabetes who presented from her SNF appearing altered and hypoxic. She was reportedly only responsive to painful stimuli upon arrival. She was requiring 10 L nonrebreather via EMS on arrival which improved to 4 L nasal cannula oxygen in the ER.   Chest x-ray raised concern for pneumonia.  There was concern for aspiration.  Patient was hospitalized for further management.  Hospital Course:   Acute respiratory failure with hypoxia secondary to aspiration pneumonia -Improving, O2 sats 91-95% on  room air -Patient was initially placed on IV Unasyn, then transitioned to oral Augmentin -Patient has poor dentition, concern for oral pharyngeal dysphagia -Patient was seen by speech therapy, placed on dysphagia 2 diet with thin liquids   Acute kidney injury on CKD stage IIIb -Baseline creatinine around 1.8, had creatinine of 3.0 at presentation -Patient was placed on IV fluids, encouraged oral intake -Creatinine now at baseline 1.6, improved  Diabetes mellitus type 2 with renal complications, IDDM -Hemoglobin A1c 6.8, continue sliding scale insulin -CBGs stable   Acute metabolic encephalopathy, underlying history of schizoaffective disorder, depression -Baseline mental status is not clearly known, however currently alert and awake, appropriately responding -CT head did not show any acute pathology, showed multiple remote infarcts, may have underlying vascular dementia -Episodes of agitation, most likely sundowning, likely has cognitive impairment as well -She was changed over to Seroquel from Risperdal.  QTc normal -Currently stable  Essential hypertension -Continue amlodipine, metoprolol  History of schizoaffective disorder/depression Prior to admission, noted to be on benztropine, fluoxetine, Risperdal, trazodone -Risperdal was switched to Seroquel    Obesity Estimated body mass index is 37.2 kg/m as calculated from the following:   Height as of this encounter: 5\' 3"  (1.6 m).   Weight as of this encounter: 95.3 kg.  Day of Discharge S: No acute events overnight, no acute issues.  No fevers chills, alert and awake  BP (!) 139/83 (BP Location: Right Arm)   Pulse 67   Temp 98 F (36.7 C) (Oral)   Resp 20   Ht 5\' 3"  (1.6 m)   Wt 95.3 kg   SpO2 91%   BMI 37.20 kg/m   Physical Exam: General: Alert and awake oriented to self, responding appropriately HEENT: anicteric sclera, pupils reactive to light and accommodation CVS: S1-S2 clear no murmur rubs or  gallops Chest: clear to auscultation bilaterally, no wheezing rales or rhonchi Abdomen: soft nontender, nondistended, normal bowel sounds Extremities: no cyanosis, clubbing or edema noted bilaterally Neuro: no new deficits    Get Medicines reviewed and adjusted: Please take all your medications with you for your next visit with your Primary MD  Please request your Primary MD to go over all hospital tests and procedure/radiological results at the follow up. Please ask your Primary MD to get all Hospital records sent to his/her office.  If you experience worsening of your admission symptoms, develop shortness of breath, life threatening emergency, suicidal or homicidal thoughts you must seek medical attention immediately by calling 911 or calling your MD immediately  if symptoms less severe.  You must read complete instructions/literature along with all the possible adverse reactions/side effects for all the Medicines you take and that have been prescribed to you. Take any new Medicines after you have completely understood and accept all the possible adverse reactions/side effects.   Do not drive when taking pain medications.   Do not take more than prescribed Pain, Sleep and Anxiety Medications  Special Instructions: If you have  smoked or chewed Tobacco  in the last 2 yrs please stop smoking, stop any regular Alcohol  and or any Recreational drug use.  Wear Seat belts while driving.  Please note  You were cared for by a hospitalist during your hospital stay. Once you are discharged, your primary care physician will handle any further medical issues. Please note that NO REFILLS for any discharge medications will be authorized once you are discharged, as it is imperative that you return to your primary care physician (or establish a relationship with a primary care physician if you do not have one) for your aftercare needs so that they can reassess your need for medications and monitor your  lab values.   The results of significant diagnostics from this hospitalization (including imaging, microbiology, ancillary and laboratory) are listed below for reference.      Procedures/Studies:  CT Head Wo Contrast  Result Date: 06/07/2020 CLINICAL DATA:  Altered mental status EXAM: CT HEAD WITHOUT CONTRAST TECHNIQUE: Contiguous axial images were obtained from the base of the skull through the vertex without intravenous contrast. COMPARISON:  08/28/2017 FINDINGS: Brain: Encephalomalacia is again seen within the a para falcine right frontal lobe, left corona radiata and cerebellar hemispheres bilaterally in keeping with remote infarcts. Since the prior examination, there has developed encephalomalacia within the right parietal lobe, left para falcine occipital lobe, and posterior left temporal lobe in keeping with remote infarcts, though new from prior examination. There is no evidence of acute intracranial hemorrhage or infarct. Moderate periventricular white matter changes are present likely reflecting the sequela of small vessel ischemia. No abnormal mass effect or midline shift. No abnormal intra or extra-axial mass lesion or fluid collection. The ventricular size is normal. Vascular: No hyperdense vasculature noted at the skull base. Extensive atherosclerotic calcification is seen within the carotid siphons. Skull: Intact Sinuses/Orbits: The paranasal sinuses are clear. 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 Right orbit is unremarkable. Other: Mastoid air cells and middle ear cavities are clear. Subcutaneous soft tissue nodule within the frontal scalp, axial image # 21/2, is indeterminate, but enlarged since prior examination measuring 11 mm. No erosion of the subjacent calvarium. 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 No evidence of acute intracranial hemorrhage or infarct, however. Electronically Signed   By: Helyn Numbers  MD   On: 06/07/2020 18:40   US RENAL  Result Date: 06/12/2020 CLINICAL DATA:  Acute kidney injury. EXAM: RENAL / URINARY TRACT ULTRASOUND COMPLETE COMPARISON:  CT 08/25/2019 FINDINGS: Right Kidney: Renal measurements: 8.8 x 4.1 x 5.2 cm = volume: 98 mL . Echogenicity within normal limits. No mass or hydronephrosis visualized. Some renal vascular calcification is present. Left Kidney: Renal measurements: 9.0 x 4.8 x 4.4 cm = volume: 98 mL. Echogenicity is normal. No hydronephrosis. 1.4 cm cyst at the upper pole. Some renal vascular calcification is present. Bladder: Appears normal for degree of bladder distention. Other: None. IMPRESSION: No hydronephrosis. Normal size kidneys, unchanged since CT scan of October 2020. Normal echogenicity. Some calcification of the renal arterial branch vessels is noted. Electronically Signed   By: Paulina Fusi M.D.   On: 06/12/2020 11:48   DG CHEST PORT 1 VIEW  Result Date: 06/09/2020 CLINICAL DATA:  Hypoxia.  Follow-up pneumonia. EXAM: PORTABLE CHEST 1 VIEW COMPARISON:  06/07/2020. FINDINGS: Mediastinum hilar structures normal. Persistent bibasilar atelectasis/infiltrates with slight improvement in aeration from prior exam. No pleural effusion or pneumothorax. Heart size stable. No acute bony abnormality. IMPRESSION: Persistent bibasilar  atelectasis/infiltrates with slight improvement in aeration from prior exam. Electronically Signed   By: Maisie Fus  Register   On: 06/09/2020 06:56   DG Chest Port 1 View  Result Date: 06/07/2020 CLINICAL DATA:  Altered mental status EXAM: PORTABLE CHEST 1 VIEW COMPARISON:  05/29/2020 FINDINGS: Bibasilar pulmonary infiltrates have developed, infection versus aspiration. No pneumothorax or pleural effusion. Cardiac size within normal limits. Pulmonary vascularity is normal. No acute bone abnormality. IMPRESSION: Interval development of bibasilar pulmonary infiltrates, infection versus aspiration. Electronically Signed   By: Helyn Numbers MD    On: 06/07/2020 18:05       LAB RESULTS: Basic Metabolic Panel: Recent Labs  Lab 06/08/20 0258 06/09/20 0922 06/13/20 0305 06/14/20 0320  NA 142   < > 139 139  K 4.5   < > 4.5 4.2  CL 108   < > 110 109  CO2 23   < > 20* 20*  GLUCOSE 87   < > 110* 119*  BUN 29*   < > 19 16  CREATININE 2.33*   < > 1.88* 1.64*  CALCIUM 8.8*   < > 8.2* 8.8*  MG 2.0  --   --   --    < > = values in this interval not displayed.   Liver Function Tests: Recent Labs  Lab 06/07/20 1612  AST 12*  ALT 10  ALKPHOS 60  BILITOT 0.3  PROT 6.8  ALBUMIN 3.5   No results for input(s): LIPASE, AMYLASE in the last 168 hours. No results for input(s): AMMONIA in the last 168 hours. CBC: Recent Labs  Lab 06/07/20 1612 06/07/20 1716 06/12/20 0428 06/12/20 0428 06/13/20 0305  WBC 6.8   < > 7.2  --  6.3  NEUTROABS 4.3  --   --   --   --   HGB 12.5   < > 11.0*  --  10.0*  HCT 40.6   < > 33.5*  --  30.5*  MCV 100.5*   < > 95.7   < > 96.2  PLT 172   < > 163  --  137*   < > = values in this interval not displayed.   Cardiac Enzymes: No results for input(s): CKTOTAL, CKMB, CKMBINDEX, TROPONINI in the last 168 hours. BNP: Invalid input(s): POCBNP CBG: Recent Labs  Lab 06/14/20 0437 06/14/20 0722  GLUCAP 106* 127*       Disposition and Follow-up: Discharge Instructions    Increase activity slowly   Complete by: As directed        DISPOSITION: Skilled nursing facility   DISCHARGE FOLLOW-UP  Contact information for after-discharge care    Destination    [facility] .   Service: Skilled Nursing Contact information: [address] [place]. [place] [address] [phone]                   Time coordinating discharge:  35 minutes  Signed:   Thad Ranger M.D. [facility] 06/14/2020, 12:50 PM

## 2020-06-14 NOTE — TOC Transition Note (Signed)
Transition of Care [facility]) - CM/SW Discharge Note   Patient Details  Name: Carrie Benson MRN: [id number] Date of Birth: [birth year]  Transition of Care [facility] CM/SW Contact:  Amada Jupiter, LCSW Phone Number: 06/14/2020, 2:17 PM   Clinical Narrative:    Received insurance auth, medical clearance and family accepted bed offer at [facility] [place].  DC today via [facility].  No further TOC needs.   Final next level of care: Skilled Nursing Facility Barriers to Discharge: Barriers Resolved   Patient Goals and CMS Choice Patient states their goals for this hospitalization and ongoing recovery are:: not able to state no family in room. CMS Medicare.gov Compare Post Acute Care list provided to:: Patient    Discharge Placement              Patient chooses bed at: [facility] Patient to be transferred to facility by: [facility] Name of family member notified: [email or web address], Tamika Patient and family notified of of transfer: 06/14/20  Discharge Plan and Services   Discharge Planning Services: CM Consult            DME Arranged: N/A DME Agency: NA       HH Arranged: NA HH Agency: NA        Social Determinants of Health (SDOH) Interventions     Readmission Risk Interventions Readmission Risk Prevention Plan 06/14/2020 06/12/2020  Transportation Screening Complete Complete  PCP or Specialist Appt within 5-7 Days Complete Complete  Home Care Screening Complete Complete  Medication Review (RN CM) Complete Complete  Some recent data might be hidden

## 2020-10-18 DEATH — deceased

## 2020-11-14 IMAGING — CT CT HEAD W/O CM
3 series · 15 of 47 positions shown, 18 images · non-contrast
Comparison: 08/28/2017

CLINICAL DATA: Altered mental status

EXAM:
CT HEAD WITHOUT CONTRAST
TECHNIQUE: Contiguous axial images were obtained from the base of the skull
through the vertex without intravenous contrast.

[Series 2: head wo · axial · 0.41mm/px · z∈[-457,-312]mm · 9 of 35 slices shown, 12 images]
[im 3/35  brain]
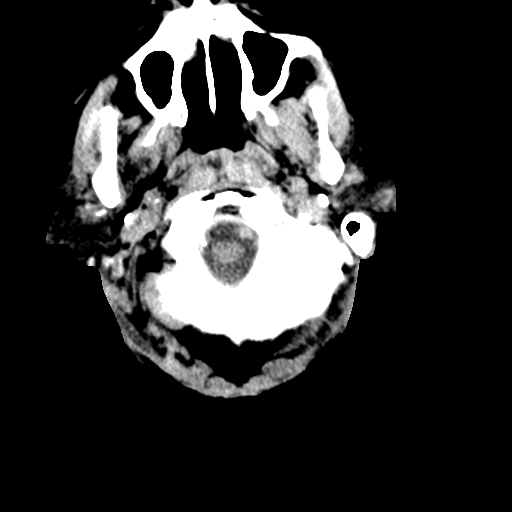
[im 3/35  bone]
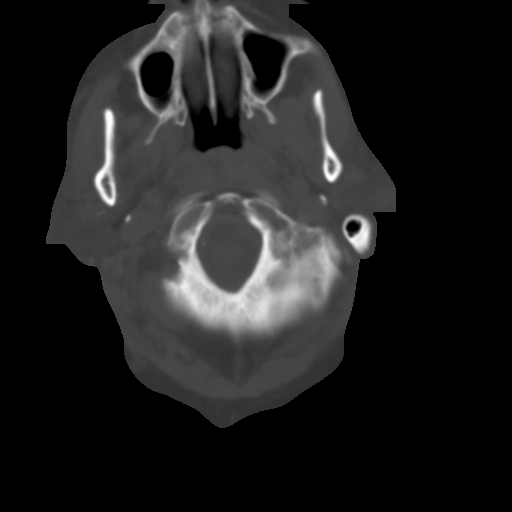
[im 6/35  brain]
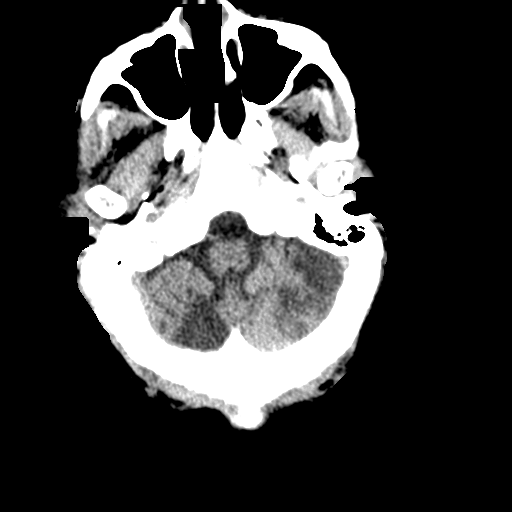
[im 10/35  brain]
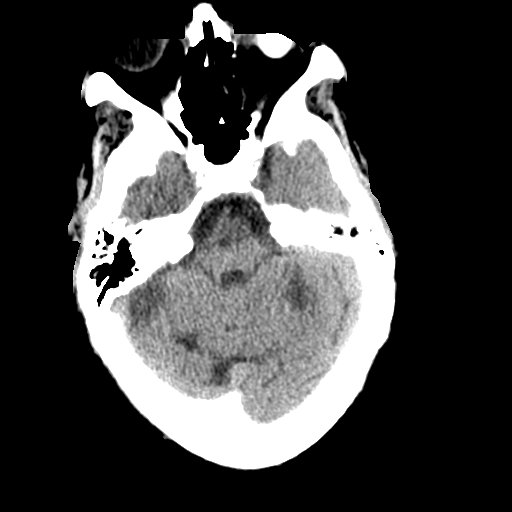
[im 13/35  brain]
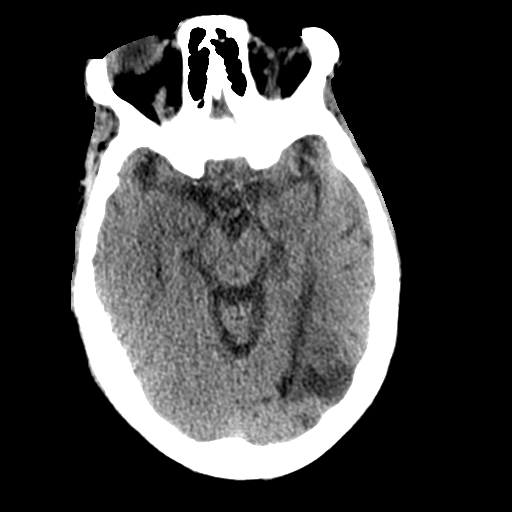
[im 18/35  brain]
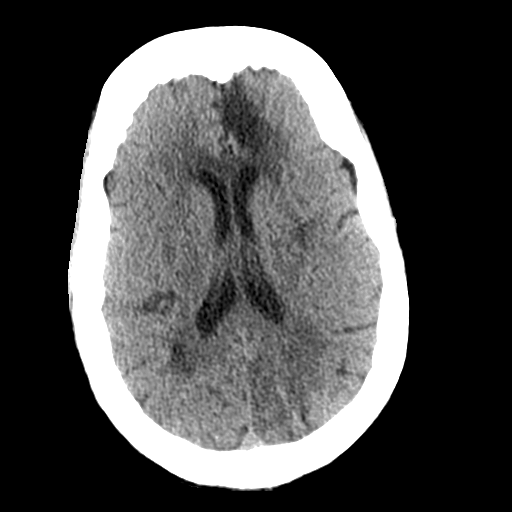
[im 18/35  bone]
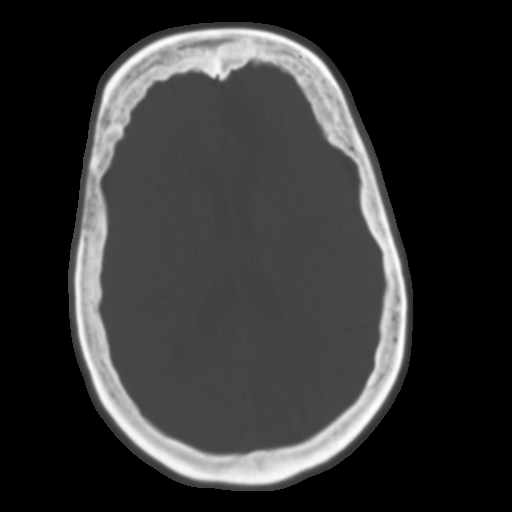
[im 22/35  brain]
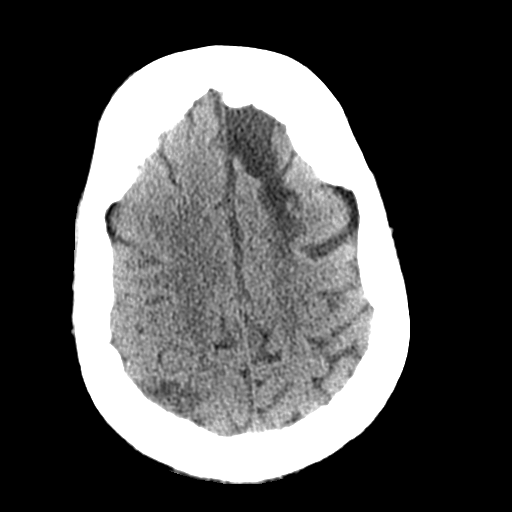
[im 25/35  brain]
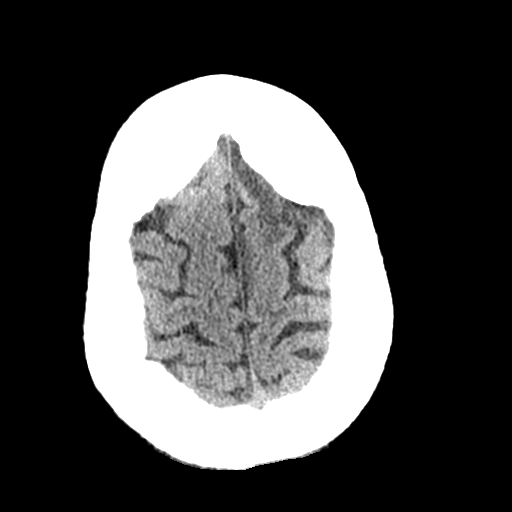
[im 29/35  brain]
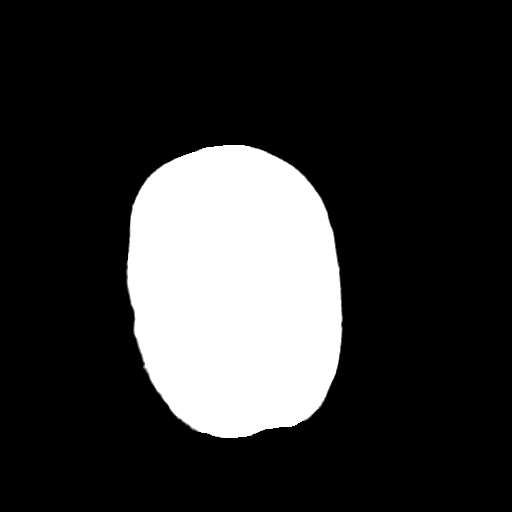
[im 32/35  brain]
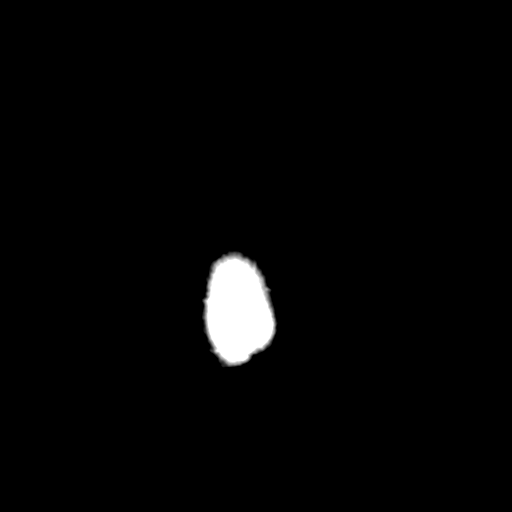
[im 32/35  bone]
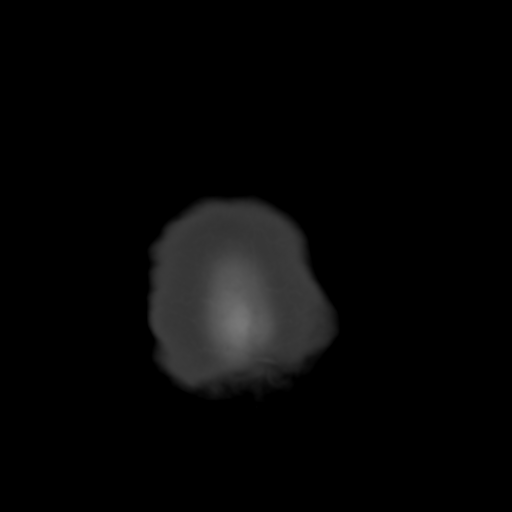

[Series 5: coronal soft tissue · coronal · 0.33mm/px · 3 of 69 slices shown]
[im 27/69  brain]
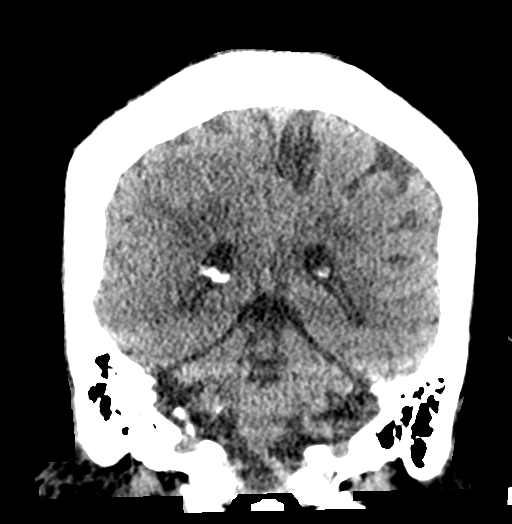
[im 32/69  brain]
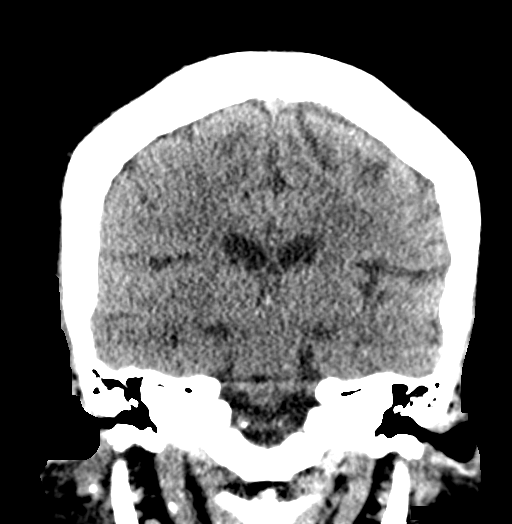
[im 37/69  brain]
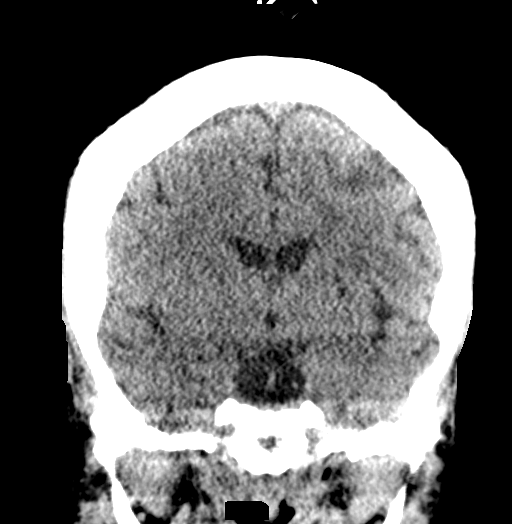

[Series 6: sagittal soft tissue · sagittal · 0.34mm/px · 3 of 57 slices shown]
[im 19/57  brain]
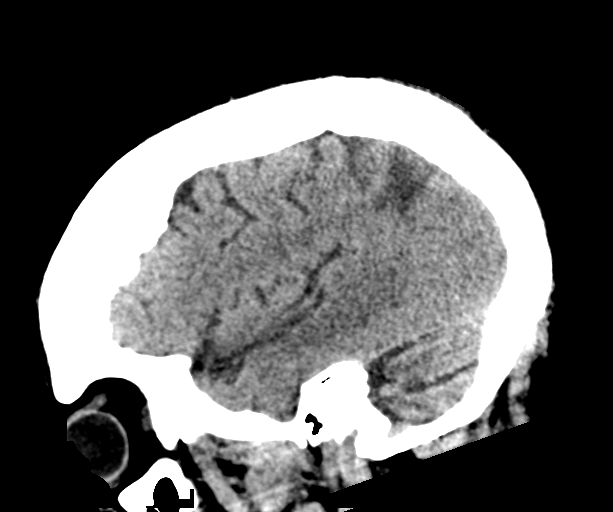
[im 29/57  brain]
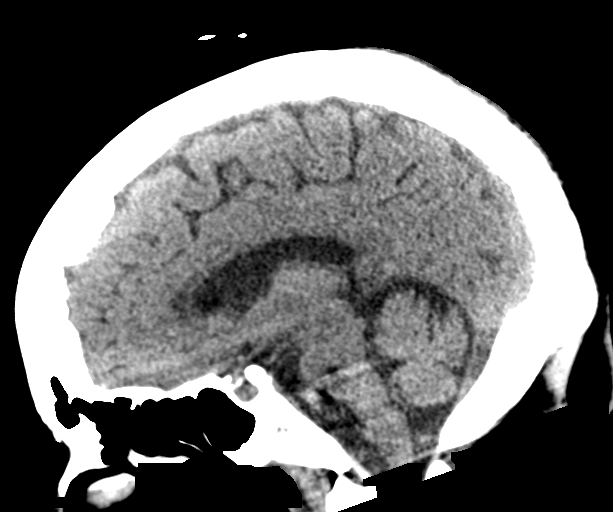
[im 38/57  brain]
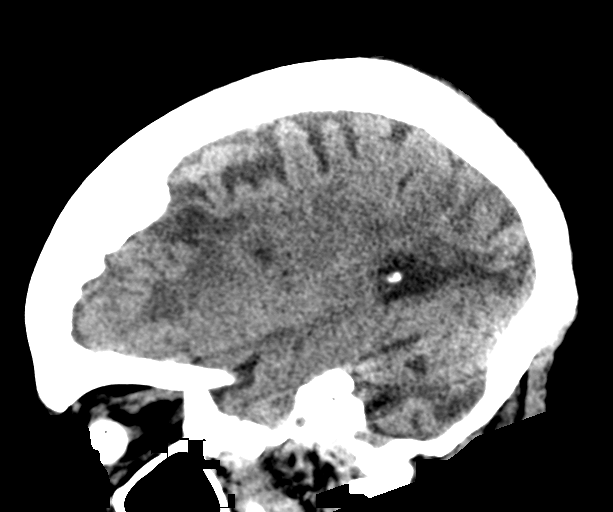

[15 of 47 positions shown; findings below may reference images not displayed]

FINDINGS: Brain: Encephalomalacia is again seen within the a para falcine
right frontal lobe, left corona radiata and cerebellar hemispheres
bilaterally in keeping with remote infarcts. Since the prior
examination, there has developed encephalomalacia within the right
parietal lobe, left para falcine occipital lobe, and posterior left
temporal lobe in keeping with remote infarcts, though new from prior
examination. There is no evidence of acute intracranial hemorrhage
or infarct. Moderate periventricular white matter changes are
present likely reflecting the sequela of small vessel ischemia.

No abnormal mass effect or midline shift. No abnormal intra or
extra-axial mass lesion or fluid collection. The ventricular size is
normal.

Vascular: No hyperdense vasculature noted at the skull base.
Extensive atherosclerotic calcification is seen within the carotid
siphons.

Skull: Intact

Sinuses/Orbits: The paranasal sinuses are clear. Left ocular
prosthesis is again identified. Right orbit is unremarkable.

Other: Mastoid air cells and middle ear cavities are clear.
Subcutaneous soft tissue nodule within the frontal scalp, axial
image # [DATE], is indeterminate, but enlarged since prior examination
measuring 11 mm. No erosion of the subjacent calvarium.
IMPRESSION: Multiple remote infarcts with several infarcts having developed
since remote prior examination. No evidence of acute intracranial
hemorrhage or infarct, however.
